# Patient Record
Sex: Female | Born: 1995 | Race: Black or African American | Hispanic: No | Marital: Single | Smoking: Never smoker
Health system: Southern US, Community
[De-identification: ages and names within clinical notes are randomized; demographics above are authoritative.]

## PROBLEM LIST (undated history)

## (undated) ENCOUNTER — Inpatient Hospital Stay (HOSPITAL_COMMUNITY): Payer: Self-pay

## (undated) DIAGNOSIS — R87619 Unspecified abnormal cytological findings in specimens from cervix uteri: Secondary | ICD-10-CM

## (undated) DIAGNOSIS — J45909 Unspecified asthma, uncomplicated: Secondary | ICD-10-CM

## (undated) DIAGNOSIS — J40 Bronchitis, not specified as acute or chronic: Secondary | ICD-10-CM

## (undated) HISTORY — DX: Unspecified abnormal cytological findings in specimens from cervix uteri: R87.619

## (undated) HISTORY — PX: WISDOM TOOTH EXTRACTION: SHX21

---
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
## 2013-03-20 NOTE — L&D Delivery Note (Signed)
I have seen and examined this patient and I agree with the above. Cam HaiSHAW, KIMBERLY CNM 2:16 AM 12/27/2013

## 2013-03-20 NOTE — L&D Delivery Note (Addendum)
Delivery Note At 11:21 PM a viable female was delivered via Vaginal, Spontaneous Delivery (Presentation: ; Occiput Anterior).  Loose nuchal cord x 1. APGAR: 9, 9; weight skin to skin, pending.   Placenta status: Intact, Spontaneous.  Cord: 3 vessels with the following complications: None.    Anesthesia: Epidural  Episiotomy: None Lacerations: 1st degree;Vaginal, perineal Suture Repair: 3.0 vicryl Est. Blood Loss (mL): 200  Meconium fluid noted.  Mom to postpartum.  Baby to Couplet care / Skin to Skin.  Tawni CarnesWight, Bev Drennen 12/26/2013, 11:55 PM

## 2013-04-23 ENCOUNTER — Emergency Department (HOSPITAL_COMMUNITY)
Admission: EM | Admit: 2013-04-23 | Discharge: 2013-04-23 | Disposition: A | Discharge status: Home / Self Care | Payer: No Typology Code available for payment source | Attending: Emergency Medicine | Admitting: Emergency Medicine

## 2013-04-23 ENCOUNTER — Encounter (HOSPITAL_COMMUNITY): Payer: Self-pay | Admitting: Emergency Medicine

## 2013-04-23 DIAGNOSIS — M259 Joint disorder, unspecified: Secondary | ICD-10-CM | POA: Insufficient documentation

## 2013-04-23 DIAGNOSIS — O9989 Other specified diseases and conditions complicating pregnancy, childbirth and the puerperium: Secondary | ICD-10-CM | POA: Insufficient documentation

## 2013-04-23 DIAGNOSIS — Z3490 Encounter for supervision of normal pregnancy, unspecified, unspecified trimester: Secondary | ICD-10-CM

## 2013-04-23 DIAGNOSIS — J111 Influenza due to unidentified influenza virus with other respiratory manifestations: Secondary | ICD-10-CM | POA: Insufficient documentation

## 2013-04-23 DIAGNOSIS — Z79899 Other long term (current) drug therapy: Secondary | ICD-10-CM | POA: Insufficient documentation

## 2013-04-23 DIAGNOSIS — M545 Low back pain, unspecified: Secondary | ICD-10-CM | POA: Insufficient documentation

## 2013-04-23 DIAGNOSIS — J45909 Unspecified asthma, uncomplicated: Secondary | ICD-10-CM | POA: Insufficient documentation

## 2013-04-23 DIAGNOSIS — M899 Disorder of bone, unspecified: Secondary | ICD-10-CM | POA: Insufficient documentation

## 2013-04-23 LAB — URINALYSIS, ROUTINE W REFLEX MICROSCOPIC
BILIRUBIN URINE: NEGATIVE
Glucose, UA: NEGATIVE mg/dL
HGB URINE DIPSTICK: NEGATIVE
KETONES UR: 15 mg/dL — AB
Leukocytes, UA: NEGATIVE
NITRITE: NEGATIVE
PROTEIN: NEGATIVE mg/dL
SPECIFIC GRAVITY, URINE: 1.029 (ref 1.005–1.030)
UROBILINOGEN UA: 1 mg/dL (ref 0.0–1.0)
pH: 6 (ref 5.0–8.0)

## 2013-04-23 LAB — POCT PREGNANCY, URINE: Preg Test, Ur: POSITIVE — AB

## 2013-04-23 LAB — RAPID STREP SCREEN (MED CTR MEBANE ONLY): STREPTOCOCCUS, GROUP A SCREEN (DIRECT): NEGATIVE

## 2013-04-23 MED ORDER — OSELTAMIVIR PHOSPHATE 75 MG PO CAPS: 75.0000 mg | ORAL_CAPSULE | Freq: Two times a day (BID) | ORAL | Status: DC
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
## 2013-04-23 NOTE — ED Notes (Signed)
Pt states had a sore throat yesterday, today no sore throat but has nasal congestion. Pt also reports onset sharp lower back pain today when standing up.

## 2013-04-23 NOTE — Discharge Instructions (Signed)
Influenza, Adult Influenza ("the flu") is a viral infection of the respiratory tract. It occurs more often in winter months because people spend more time in close contact with one another. Influenza can make you feel very sick. Influenza easily spreads from person to person (contagious). CAUSES  Influenza is caused by a virus that infects the respiratory tract. You can catch the virus by breathing in droplets from an infected person's cough or sneeze. You can also catch the virus by touching something that was recently contaminated with the virus and then touching your mouth, nose, or eyes. SYMPTOMS  Symptoms typically last 4 to 10 days and may include:  Fever.  Chills.  Headache, body aches, and muscle aches.  Sore throat.  Chest discomfort and cough.  Poor appetite.  Weakness or feeling tired.  Dizziness.  Nausea or vomiting. DIAGNOSIS  Diagnosis of influenza is often made based on your history and a physical exam. A nose or throat swab test can be done to confirm the diagnosis. RISKS AND COMPLICATIONS You may be at risk for a more severe case of influenza if you smoke cigarettes, have diabetes, have chronic heart disease (such as heart failure) or lung disease (such as asthma), or if you have a weakened immune system. Elderly people and pregnant women are also at risk for more serious infections. The most common complication of influenza is a lung infection (pneumonia). Sometimes, this complication can require emergency medical care and may be life-threatening. PREVENTION  An annual influenza vaccination (flu shot) is the best way to avoid getting influenza. An annual flu shot is now routinely recommended for all adults in the [place] TREATMENT  In mild cases, influenza goes away on its own. Treatment is directed at relieving symptoms. For more severe cases, your caregiver may prescribe antiviral medicines to shorten the sickness. Antibiotic medicines are not effective, because the  infection is caused by a virus, not by bacteria. HOME CARE INSTRUCTIONS  Only take over-the-counter or prescription medicines for pain, discomfort, or fever as directed by your caregiver.  Use a cool mist humidifier to make breathing easier.  Get plenty of rest until your temperature returns to normal. This usually takes 3 to 4 days.  Drink enough fluids to keep your urine clear or pale yellow.  Cover your mouth and nose when coughing or sneezing, and wash your hands well to avoid spreading the virus.  Stay home from work or school until your fever has been gone for at least 1 full day. SEEK MEDICAL CARE IF:   You have chest pain or a deep cough that worsens or produces more mucus.  You have nausea, vomiting, or diarrhea. SEEK IMMEDIATE MEDICAL CARE IF:   You have difficulty breathing, shortness of breath, or your skin or nails turn bluish.  You have severe neck pain or stiffness.  You have a severe headache, facial pain, or earache.  You have a worsening or recurring fever.  You have nausea or vomiting that cannot be controlled. MAKE SURE YOU:  Understand these instructions.  Will watch your condition.  Will get help right away if you are not doing well or get worse. Document Released: 03/03/2000 Document Revised: 09/05/2011 Document Reviewed: 06/05/2011 [facility] Patient Information 2014 [place], [place].  Medicines During Pregnancy During pregnancy, there are medicines that are either safe or unsafe to take. Medicines include prescriptions from your caregiver, over-the-counter medicines, topical creams applied to the skin, and all herbal substances. Medicines are put into either Class A, B, C, or  D. Class A and B medicines have been shown to be safe in pregnancy. Class C medicines are also considered to be safe in pregnancy, but these medicines should only be used when necessary. Class D medicines should not be used at all in pregnancy. They can be harmful to a baby.  It is  best to take as little medicine as possible while pregnant. However, some medicines are necessary to take for the mother and baby's health. Sometimes, it is more dangerous to stop taking certain medicines than to stay on them. This is often the case for people with long-term (chronic) conditions such as asthma, diabetes, or high blood pressure (hypertension). If you are pregnant and have a chronic illness, call your caregiver right away. Bring a list of your medicines and their doses to your appointments. If you are planning to become pregnant, schedule a doctor's appointment and discuss your medicines with your caregiver. Lastly, write down the phone number to your pharmacist. They can answer questions regarding a medicine's class and safety. They cannot give advice as to whether you should or should not be on a medicine.  SAFE AND UNSAFE MEDICINES There is a long list of medicines that are considered safe in pregnancy. Below is a shorter list. For specific medicines, ask your caregiver.  AllergyMedicines Loratadine, cetirizine, and chlorpheniramine are safe to take. Certain nasal steroid sprays are safe. Talk to your caregiver about specific brands that are safe. Analgesics Acetaminophen and acetaminophen with codeine are safe to take. All other nonsteroidal anti-inflammatory drugs (NSAIDS) are not safe. This includes ibuprofen.  Antacids Many over-the-counter antacids are safe to take. Talk to your caregiver about specific brands that are safe. Famotidine, ranitidine, and lansoprazole are safe. Omepresole is considered safe to take in the second trimester. Antibiotic Medicines There are several antibiotics to avoid. These include, but are not limited to, tetracyline, quinolones, and sulfa medications. Talk to your caregiver before taking any antibiotic.  Antihistamines Talk to your caregiver about specific brands that are safe.  Asthma Medicines Most asthma steroid inhalers are safe to take.  Talk to your caregiver for specific details. Calcium Calcium supplements are safe to take. Do not take oyster shell calcium.  Cough and Cold Medicines It is safe to take products with guaifenesin or dextromethorphan. Talk to your caregiver about specific brands that are safe. It is not safe to take products that contain aspirin or ibuprofen. Decongestant Medicines Pseudoephedrine-containing products are safe to take in the second and third trimester.  Depression Medicines Talk about these medicines with your caregiver.  Antidiarrheal Medicines It is safe to take loperamide. Talk to your caregiver about specific brands that are safe. It is not safe to take any antidiarrheal medicine that contains bismuth. Eyedrops Allergy eyedrops should be limited.  Iron It is safe to use certain iron-containing medicines for anemia in pregnancy. They require a prescription.  Antinausea Medicines It is safe to take doxylamine and vitamin B6 as directed. There are other prescription medicines available, if needed.  Sleep aids It is safe to take diphenhydramine and acetaminophen with diphenhydramine.  Steroids Hydrocortisone creams are safe to use as directed. Oral steroids require a prescription. It is not safe to take any hemorrhoid cream with pramoxine or phenylephrine. Stool softener It is safe to take stool softener medicines. Avoid daily or prolonged use of stool softeners. Thyroid Medicine It is important to stay on this thyroid medicine. It needs to be followed by your caregiver.  Vaginal Medicines Your caregiver will prescribe  a medicine to you if you have a vaginal infection. Certain antifungal medicines are safe to use if you have a sexually transmitted infection (STI). Talk to your caregiver.  Document Released: 03/06/2005 Document Revised: 05/29/2011 Document Reviewed: 03/07/2011 [facility] Patient Information 2014 [place], [place].  Pregnancy If you are planning on getting  pregnant, it is a good idea to make a preconception appointment with your caregiver to discuss having a healthy lifestyle before getting pregnant. This includes diet, weight, exercise, taking prenatal vitamins (especially folic acid, which helps prevent brain and spinal cord defects), avoiding alcohol, smoking and illegal drugs, medical problems (diabetes, convulsions), family history of genetic problems, working conditions, and immunizations. It is better to have knowledge of these things and do something about them before getting pregnant. During your pregnancy, it is important to follow certain guidelines in order to have a healthy baby. It is very important to get good prenatal care and follow your caregiver's instructions. Prenatal care includes all the medical care you receive before your baby's birth. This helps to prevent problems during the pregnancy and childbirth. HOME CARE INSTRUCTIONS   Start your prenatal visits by the 12th week of pregnancy or earlier, if possible. At first, appointments are usually scheduled monthly. They become more frequent in the last 2 months before delivery. It is important that you keep your caregiver's appointments and follow your caregiver's instructions regarding medication use, exercise, and diet.  During pregnancy, you are providing food for you and your baby. Eat a regular, well-balanced diet. Choose foods such as meat, fish, milk and other dairy products, vegetables, fruits, whole-grain breads and cereals. Your caregiver will inform you of the ideal weight gain depending on your current height and weight. Drink lots of liquids. Try to drink 8 glasses of water a day.  Alcohol is associated with a number of birth defects including fetal alcohol syndrome. It is best to avoid alcohol completely. Smoking will cause low birth rate and prematurity. Use of alcohol and nicotine during your pregnancy also increases the chances that your child will be chemically dependent  later in their life and may contribute to SIDS (Sudden Infant Death Syndrome).  Do not use illegal drugs.  Only take prescription or over-the-counter medications that are recommended by your caregiver. Other medications can cause genetic and physical problems in the baby.  Morning sickness can often be helped by keeping soda crackers at the bedside. Eat a few before getting up in the morning.  A sexual relationship may be continued until near the end of pregnancy if there are no other problems such as early (premature) leaking of amniotic fluid from the membranes, vaginal bleeding, painful intercourse or belly (abdominal) pain.  Exercise regularly. Check with your caregiver if you are unsure of the safety of some of your exercises.  Do not use hot tubs, steam rooms or saunas. These increase the risk of fainting and hurting yourself and the baby. Swimming is OK for exercise. Get plenty of rest, including afternoon naps when possible, especially in the third trimester.  Avoid toxic odors and chemicals.  Do not wear high heels. They may cause you to lose your balance and fall.  Do not lift over 5 pounds. If you do lift anything, lift with your legs and thighs, not your back.  Avoid long trips, especially in the third trimester.  If you have to travel out of the city or state, take a copy of your medical records with you. SEEK IMMEDIATE MEDICAL CARE IF:  You develop an unexplained oral temperature above 102 F (38.9 C), or as your caregiver suggests.  You have leaking of fluid from the vagina. If leaking membranes are suspected, take your temperature and inform your caregiver of this when you call.  There is vaginal spotting or bleeding. Notify your caregiver of the amount and how many pads are used.  You continue to feel sick to your stomach (nauseous) and have no relief from remedies suggested, or you throw up (vomit) blood or coffee ground like materials.  You develop upper  abdominal pain.  You have round ligament discomfort in the lower abdominal area. This still must be evaluated by your caregiver.  You feel contractions of the uterus.  You do not feel the baby move, or there is less movement than before.  You have painful urination.  You have abnormal vaginal discharge.  You have persistent diarrhea.  You get a severe headache.  You have problems with your vision.  You develop muscle weakness.  You feel dizzy and faint.  You develop shortness of breath.  You develop chest pain.  You have back pain that travels down to your leg and feet.  You feel irregular or a very fast heartbeat.  You develop excessive weight gain in a short period of time (5 pounds in 3 to 5 days).  You are involved in a domestic violence situation. Document Released: 03/06/2005 Document Revised: 09/05/2011 Document Reviewed: 08/28/2008 [facility] Patient Information 2014 [place].  Pregnancy and Medications Most of the time, medicine a pregnant woman is taking does not enter the fetus, but sometimes it can. This may cause damage or birth defects. The risk of damage being done to a fetus is the greatest in the first few weeks of pregnancy. This is when major organs are developing. If you are taking any prescription, over-the-counter or herbal medicines, it is best to talk to your caregiver about the medications you are taking before getting pregnant. If you become pregnant, stop taking over-the-counter and herbal medications right away. Tell your caregiver what you were taking. Also, tell him/her medications, if any, you took before knowing you were pregnant. Never take any drugs during pregnancy unless your caregiver gives you permission. Other things like caffeine, vitamins, herbal teas and remedies can affect the growing fetus. Talk to your caregiver about cutting down on caffeine. Also, ask what type of vitamins you need to take. Never use any herbal product without  talking to your caregiver first.  MEDICINES THAT ARE NOT SAFE TO TAKE DURING PREGNANCY   Category A - drugs that have been tested for safety during pregnancy and have been found to be safe. This includes drugs such as:  Folic acid.  Vitamin B6.  Thyroid medicine in moderation or in prescribed doses.  Category B - drugs that have been used a lot during pregnancy and do not appear to cause major birth defects or other problems. This includes drugs such as:  Some antibiotics.  Acetaminophen (Tylenol).  Aspartame (artificial sweetener).  Famotidine (Pepcid).  Prednisone (cortisone).  Insulin (for diabetes).  Ibuprofen (Advil, Motrin) before the third trimester. Pregnant women should not take ibuprofen during the last three months of pregnancy.  Category C - drugs that are more likely to cause problems for the mother or fetus. It also includes drugs for which safety studies have not been finished. The majority of these drugs do not have safety studies in progress. These drugs often come with a warning that they should be used  only if the benefits of taking them outweigh the risks. This is something a woman would need to carefully discuss with her caregiver. These drugs include:  Prochlorperzaine (Compazine).  Sudafed.  Fluconazole (Diflucan).  Ciprofloxacin (Cipro).  Some antidepressants are also included in this group.  Category D - drugs that have clear health risks for the fetus. They include:  Alcohol.  Lithium (used to treat manic depression).  Phenytoin (Dilantin).  Most chemotherapy drugs to treat cancer. In some cases, chemotherapy drugs are given during pregnancy.  Category X - drugs that have been shown to cause birth defects. They should never be taken during pregnancy. These include:  Drugs to treat skin conditions like cystic acne (Accutane) and psoriasis (Tegison or Soriatane).  Thalidomide (a sedative).  Diethylstilbestrol or DES. No  medication is considered 100% safe to take when pregnant because everyone reacts to drugs differently. Aspirin and other drugs containing salicylate are not recommended during pregnancy, especially during the last three months because it can cause bleeding. In rare cases, a woman's caregiver may want her to use these types of drugs under close watch. Acetylsalicylate, a common ingredient in many over-the-counter painkillers, may make a pregnancy last longer. It may cause severe bleeding before and after delivery.  SHOULD I AVOID TAKING ANY MEDICINE WHILE I AM PREGNANT?  Whether or not you should continue taking medicine during pregnancy is a serious question. However, if you stop taking medicine that you need, this could harm both you and your baby. Talk to your caregiver about if the benefits outweigh the risk for you and your baby.  Pregnant and nursing women who need medication for psychiatric conditions should consult with their obstetrician, pediatrician and mental health care provider before taking any medication. NATURAL MEDICATIONS OR HERBAL REMEDIES WHEN YOU ARE PREGNANT While some herbal remedies claim they will help with pregnancy, there are no studies to prove these claims are true. Likewise, there are very few studies to look at how safe and effective herbal remedies are during pregnancy. Do not take any herbal products without talking to your caregiver first. These products may contain agents that could harm you and the growing fetus. They could cause problems with your pregnancy.  If you think or know that your mother took diethylstilbesterol (DES), a factory made estrogen, when she was pregnant with you, talk with your caregiver right away. Ask her or him about:  What types of tests you may need.  How often they need to be done.  Anything else you may need to do to make sure you do not develop any problems. A woman whose mother was given DES when pregnant should be followed and screened  for abnormalities of her female organs all through her life. OVER-THE-COUNTER MEDICATIONS THAT ARE SAFE TO TAKE DURING PREGNANCY: Any medication taken during pregnancy should be taken only with the permission of your caregiver.  Allergy (Benadryl).  Cold and Flu, Tylenol (acetaminophen). Tylenol Cold, warm salt water gargles, saline nasal drops.  Constipation (Metamucil, Citrucil, Fiberall/Fobricon, Colace, Milk of Magnesia , Senekot).  Diarrhea (Kaopectate, Immodium, Parepectolin). You should not take these in the first trimester and only take the medication for 24 hours. Call your caregiver if you still have the diarrhea.  Headache (Tylenol).  Ointment for cuts and scrapes (J & J, Bacitracin, Neosporin).  Heartburn (Tums, Riopan, titralac , Gaviscon).  Hemorrhoids (Preparation H, anusol, tucks, Witch hazel).  Nausea and vomiting (Vitamin B6 100mg  tablets, emetrol if you are not diabetic, Emetrex, sea bands).  Rashes (hydrocortisone cream or ointment, caladryl lotion or cream, benadryl cream or capsules, oatmeal bath).  Yeast infection of the vagina (Monistat cream or tablets, Terazol cream). Document Released: 03/06/2005 Document Revised: 11/06/2012 Document Reviewed: 11/18/2008 [facility] Patient Information 2014 [place], [place].

## 2013-04-24 NOTE — ED Provider Notes (Signed)
Medical screening examination/treatment/procedure(s) were performed by non-physician practitioner and as supervising physician I was immediately available for consultation/collaboration.  EKG Interpretation   None         Tatanisha Cuthbert F Amedee Cerrone, MD 04/24/13 0038 

## 2013-04-25 LAB — CULTURE, GROUP A STREP

## 2013-05-08 ENCOUNTER — Ambulatory Visit: Payer: Self-pay

## 2013-06-26 ENCOUNTER — Inpatient Hospital Stay (HOSPITAL_COMMUNITY)
Admission: AD | Admit: 2013-06-26 | Discharge: 2013-06-26 | Disposition: A | Discharge status: Home / Self Care | Payer: No Typology Code available for payment source | Source: Ambulatory Visit | Attending: Family Medicine | Admitting: Family Medicine

## 2013-06-26 ENCOUNTER — Encounter (HOSPITAL_COMMUNITY): Payer: Self-pay | Admitting: *Deleted

## 2013-06-26 ENCOUNTER — Inpatient Hospital Stay (HOSPITAL_COMMUNITY): Payer: No Typology Code available for payment source

## 2013-06-26 DIAGNOSIS — O26899 Other specified pregnancy related conditions, unspecified trimester: Secondary | ICD-10-CM

## 2013-06-26 DIAGNOSIS — R109 Unspecified abdominal pain: Secondary | ICD-10-CM | POA: Insufficient documentation

## 2013-06-26 DIAGNOSIS — O9989 Other specified diseases and conditions complicating pregnancy, childbirth and the puerperium: Principal | ICD-10-CM

## 2013-06-26 DIAGNOSIS — O99891 Other specified diseases and conditions complicating pregnancy: Secondary | ICD-10-CM | POA: Insufficient documentation

## 2013-06-26 DIAGNOSIS — J45909 Unspecified asthma, uncomplicated: Secondary | ICD-10-CM | POA: Insufficient documentation

## 2013-06-26 DIAGNOSIS — A630 Anogenital (venereal) warts: Secondary | ICD-10-CM

## 2013-06-26 LAB — URINE MICROSCOPIC-ADD ON

## 2013-06-26 LAB — ABO/RH: ABO/RH(D): A POS

## 2013-06-26 LAB — WET PREP, GENITAL
Trich, Wet Prep: NONE SEEN
Yeast Wet Prep HPF POC: NONE SEEN

## 2013-06-26 LAB — CBC WITH DIFFERENTIAL/PLATELET
Basophils Absolute: 0 10*3/uL (ref 0.0–0.1)
Basophils Relative: 0 % (ref 0–1)
Eosinophils Absolute: 0.1 10*3/uL (ref 0.0–0.7)
Eosinophils Relative: 1 % (ref 0–5)
HEMATOCRIT: 34.9 % — AB (ref 36.0–46.0)
HEMOGLOBIN: 12.5 g/dL (ref 12.0–15.0)
Lymphocytes Relative: 15 % (ref 12–46)
Lymphs Abs: 2.3 10*3/uL (ref 0.7–4.0)
MCH: 31.5 pg (ref 26.0–34.0)
MCHC: 35.8 g/dL (ref 30.0–36.0)
MCV: 87.9 fL (ref 78.0–100.0)
MONO ABS: 0.7 10*3/uL (ref 0.1–1.0)
MONOS PCT: 5 % (ref 3–12)
NEUTROS ABS: 12.2 10*3/uL — AB (ref 1.7–7.7)
Neutrophils Relative %: 80 % — ABNORMAL HIGH (ref 43–77)
Platelets: 211 10*3/uL (ref 150–400)
RBC: 3.97 MIL/uL (ref 3.87–5.11)
RDW: 12.8 % (ref 11.5–15.5)
WBC: 15.3 10*3/uL — ABNORMAL HIGH (ref 4.0–10.5)

## 2013-06-26 LAB — URINALYSIS, ROUTINE W REFLEX MICROSCOPIC
BILIRUBIN URINE: NEGATIVE
GLUCOSE, UA: NEGATIVE mg/dL
HGB URINE DIPSTICK: NEGATIVE
KETONES UR: NEGATIVE mg/dL
Nitrite: NEGATIVE
Protein, ur: NEGATIVE mg/dL
Specific Gravity, Urine: 1.02 (ref 1.005–1.030)
Urobilinogen, UA: 1 mg/dL (ref 0.0–1.0)
pH: 7 (ref 5.0–8.0)

## 2013-06-26 LAB — RPR

## 2013-06-26 LAB — OB RESULTS CONSOLE RUBELLA ANTIBODY, IGM: Rubella: NON-IMMUNE/NOT IMMUNE

## 2013-06-26 LAB — SICKLE CELL SCREEN: SICKLE CELL SCREEN: NEGATIVE

## 2013-06-26 LAB — HIV ANTIBODY (ROUTINE TESTING W REFLEX): HIV 1&2 Ab, 4th Generation: NONREACTIVE

## 2013-06-26 NOTE — MAU Provider Note (Signed)
CSN: [id number]     Arrival date & time 06/26/13  7829 History   None    Chief Complaint  Patient presents with  . Abdominal Pain     (Consider location/radiation/quality/duration/timing/severity/associated sxs/prior Treatment) Patient is a 18 y.o. female presenting with abdominal pain. The history is provided by the patient.  Abdominal Pain The primary symptoms of the illness include abdominal pain and vaginal discharge. The primary symptoms of the illness do not include fever, shortness of breath, nausea, vomiting, diarrhea, dysuria or vaginal bleeding. The current episode started 2 days ago. The onset of the illness was gradual.  The vaginal discharge is not associated with dysuria.  Associated with: pregnancy. The patient states that she believes she is currently pregnant. The patient has not had a change in bowel habit. Additional symptoms associated with the illness include frequency. Symptoms associated with the illness do not include chills, anorexia, diaphoresis, constipation, urgency, hematuria or back pain.   TAIANA TEMKIN is a 18 y.o. G1P0 @ [redacted]w[redacted]d gestation. Current sex partner x 1 year, no birth control, unprotected sex. She presents to the MAU with abdominal pain. She describes the pain as a pressure feeling in the lower abdomen. The pain increases with movement and standing. She denies vaginal bleeding. There is some vaginal discharge. She has never had a pelvic exam or pap smear. She has not started prenatal care. PMH significant for asthma.   Past Medical History  Diagnosis Date  . Asthma   . Bronchitis    Past Surgical History  Procedure Laterality Date  . Wisdom tooth extraction     No family history on file. History  Substance Use Topics  . Smoking status: Never Smoker   . Smokeless tobacco: Not on file  . Alcohol Use: No   OB History   Grav Para Term Preterm Abortions TAB SAB Ect Mult Living   1              Review of Systems  Constitutional: Negative  for fever, chills and diaphoresis.  HENT: Negative.   Eyes: Negative for visual disturbance.  Respiratory: Negative for cough and shortness of breath.   Cardiovascular: Negative for chest pain.  Gastrointestinal: Positive for abdominal pain. Negative for nausea, vomiting, diarrhea, constipation and anorexia.  Genitourinary: Positive for frequency and vaginal discharge. Negative for dysuria, urgency, hematuria and vaginal bleeding.  Musculoskeletal: Negative for back pain.  Skin: Negative for rash.  Neurological: Negative for dizziness and headaches.  Psychiatric/Behavioral: Negative for confusion. The patient is not nervous/anxious.       Allergies  Review of patient's allergies indicates no known allergies.  Home Medications  No current outpatient prescriptions on file. Ht 5\' 9"  (1.753 m)  Wt 155 lb 12.8 oz (70.67 kg)  BMI 23.00 kg/m2  LMP 03/17/2013 Physical Exam  Nursing note and vitals reviewed. Constitutional: She is oriented to person, place, and time. She appears well-developed and well-nourished. No distress.  HENT:  Head: Normocephalic.  Eyes: EOM are normal.  Neck: Neck supple.  Cardiovascular: Normal rate.   Pulmonary/Chest: Effort normal.  Abdominal: Soft. There is no tenderness.  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 Frothy discharge vaginal vault. Cervix closed, long, no CMT, no adnexal tenderness, uterus approximately 14 week size.   Musculoskeletal: Normal range of motion.  Neurological: She is alert and oriented to person, place, and time. No cranial nerve deficit.  Skin: Skin is warm and dry.  Psychiatric: She has a normal  mood and affect. Her behavior is normal.   Results for orders placed during the hospital encounter of 06/26/13 (from the past 24 hour(s))  URINALYSIS, ROUTINE W REFLEX MICROSCOPIC     Status: Abnormal   Collection Time    06/26/13  9:30 AM      Result Value Ref Range   Color,  Urine YELLOW  YELLOW   APPearance CLOUDY (*) CLEAR   Specific Gravity, Urine 1.020  1.005 - 1.030   pH 7.0  5.0 - 8.0   Glucose, UA NEGATIVE  NEGATIVE mg/dL   Hgb urine dipstick NEGATIVE  NEGATIVE   Bilirubin Urine NEGATIVE  NEGATIVE   Ketones, ur NEGATIVE  NEGATIVE mg/dL   Protein, ur NEGATIVE  NEGATIVE mg/dL   Urobilinogen, UA 1.0  0.0 - 1.0 mg/dL   Nitrite NEGATIVE  NEGATIVE   Leukocytes, UA TRACE (*) NEGATIVE  URINE MICROSCOPIC-ADD ON     Status: Abnormal   Collection Time    06/26/13  9:30 AM      Result Value Ref Range   Squamous Epithelial / LPF FEW (*) RARE   WBC, UA 0-2  <3 WBC/hpf   RBC / HPF 0-2  <3 RBC/hpf   Bacteria, UA FEW (*) RARE   Urine-Other MUCOUS PRESENT    WET PREP, GENITAL     Status: Abnormal   Collection Time    06/26/13 10:10 AM      Result Value Ref Range   Yeast Wet Prep HPF POC NONE SEEN  NONE SEEN   Trich, Wet Prep NONE SEEN  NONE SEEN   Clue Cells Wet Prep HPF POC FEW (*) NONE SEEN   WBC, Wet Prep HPF POC FEW (*) NONE SEEN  CBC WITH DIFFERENTIAL     Status: Abnormal   Collection Time    06/26/13 10:20 AM      Result Value Ref Range   WBC 15.3 (*) 4.0 - 10.5 K/uL   RBC 3.97  3.87 - 5.11 MIL/uL   Hemoglobin 12.5  12.0 - 15.0 g/dL   HCT 46.9 (*) 62.9 - 52.8 %   MCV 87.9  78.0 - 100.0 fL   MCH 31.5  26.0 - 34.0 pg   MCHC 35.8  30.0 - 36.0 g/dL   RDW 41.3  24.4 - 01.0 %   Platelets 211  150 - 400 K/uL   Neutrophils Relative % 80 (*) 43 - 77 %   Neutro Abs 12.2 (*) 1.7 - 7.7 K/uL   Lymphocytes Relative 15  12 - 46 %   Lymphs Abs 2.3  0.7 - 4.0 K/uL   Monocytes Relative 5  3 - 12 %   Monocytes Absolute 0.7  0.1 - 1.0 K/uL   Eosinophils Relative 1  0 - 5 %   Eosinophils Absolute 0.1  0.0 - 0.7 K/uL   Basophils Relative 0  0 - 1 %   Basophils Absolute 0.0  0.0 - 0.1 K/uL  ABO/RH     Status: None   Collection Time    06/26/13 10:20 AM      Result Value Ref Range   ABO/RH(D) A POS     Ultrasound + 14 week 3 day IUP with normal fluid  ED  Course  Procedures ( Labs, ultrasound, Pelvic exam, cultures sent for GC and Chlamydia. MDM  18 y.o. female @ [redacted]w[redacted]d gestation with abdominal pain. Will discuss round ligament pain and tylenol. Stable for discharge. I have reviewed this patient's vital signs, nurses notes, appropriate labs  and imaging.  I have discussed findings and plan of care. Patient voices understanding.

## 2013-06-26 NOTE — Discharge Instructions (Signed)
Someone from the GYN office will call you in the next few days to schedule an appointment. If they do not call you can call them to schedule.

## 2013-06-26 NOTE — MAU Note (Signed)
Pt C/O lower abd pain x 2 weeks, only feels it when standing, no other time.  Also N&V.  Denies bleeding.

## 2013-06-27 LAB — GC/CHLAMYDIA PROBE AMP
CT Probe RNA: NEGATIVE
GC PROBE AMP APTIMA: NEGATIVE

## 2013-07-01 LAB — RUBELLA ANTIBODY, IGM: RUBELLA: 0.35 (ref <0.90)

## 2013-07-29 ENCOUNTER — Ambulatory Visit (INDEPENDENT_AMBULATORY_CARE_PROVIDER_SITE_OTHER): Payer: No Typology Code available for payment source | Admitting: Obstetrics and Gynecology

## 2013-07-29 ENCOUNTER — Encounter: Payer: Self-pay | Admitting: Obstetrics and Gynecology

## 2013-07-29 VITALS — BP 124/72 | HR 97 | Temp 98.7°F | Wt 164.4 lb

## 2013-07-29 DIAGNOSIS — O0932 Supervision of pregnancy with insufficient antenatal care, second trimester: Secondary | ICD-10-CM | POA: Insufficient documentation

## 2013-07-29 DIAGNOSIS — A5139 Other secondary syphilis of skin: Secondary | ICD-10-CM

## 2013-07-29 DIAGNOSIS — Z3402 Encounter for supervision of normal first pregnancy, second trimester: Secondary | ICD-10-CM

## 2013-07-29 DIAGNOSIS — A5131 Condyloma latum: Secondary | ICD-10-CM | POA: Insufficient documentation

## 2013-07-29 DIAGNOSIS — O0930 Supervision of pregnancy with insufficient antenatal care, unspecified trimester: Secondary | ICD-10-CM

## 2013-07-29 DIAGNOSIS — Z3480 Encounter for supervision of other normal pregnancy, unspecified trimester: Secondary | ICD-10-CM

## 2013-07-29 DIAGNOSIS — Z3400 Encounter for supervision of normal first pregnancy, unspecified trimester: Secondary | ICD-10-CM

## 2013-07-29 DIAGNOSIS — Z3490 Encounter for supervision of normal pregnancy, unspecified, unspecified trimester: Secondary | ICD-10-CM

## 2013-07-29 LAB — POCT URINALYSIS DIP (DEVICE)
Bilirubin Urine: NEGATIVE
Glucose, UA: NEGATIVE mg/dL
Hgb urine dipstick: NEGATIVE
KETONES UR: NEGATIVE mg/dL
Nitrite: NEGATIVE
PH: 7 (ref 5.0–8.0)
PROTEIN: NEGATIVE mg/dL
Specific Gravity, Urine: 1.02 (ref 1.005–1.030)
UROBILINOGEN UA: 0.2 mg/dL (ref 0.0–1.0)

## 2013-07-29 NOTE — Progress Notes (Signed)
   Subjective:    Judith Little is a G1P0 3855w1d being seen today for her first obstetrical visit.  Her obstetrical history is significant for teen G1. Patient does intend to breast feed. Pregnancy history fully reviewed. 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 GC/CT, WP neg.  Patient reports no complaints.  Filed Vitals:   07/29/13 1340  BP: 124/72  Pulse: 97  Temp: 98.7 F (37.1 C)  Weight: 164 lb 6.4 oz (74.571 kg)    HISTORY: OB History  Gravida Para Term Preterm AB SAB TAB Ectopic Multiple Living  1             # Outcome Date GA Lbr Len/2nd Weight Sex Delivery Anes PTL Lv  1 CUR              Past Medical History  Diagnosis Date  . Asthma   . Bronchitis   No meds for asthma Past Surgical History  Procedure Laterality Date  . Wisdom tooth extraction     Family History  Problem Relation Age of Onset  . Diabetes Maternal Grandmother   . Diabetes Paternal Grandmother      Exam    Uterus:   S=D FHR 140s  Pelvic Exam:    Perineum: No Hemorrhoids No lesions   Vulva: Nl    Vagina:  Minimal possible condlyomatous lesion 1-2 mm at forchette       Cervix: L/C   Adnexa: not evaluated   Bony Pelvis: average  System: Breast:  normal appearance, no masses or tenderness   Skin: normal coloration and turgor, no rashes    Neurologic: normal, grossly non-focal   Extremities: normal strength, tone, and muscle mass   HEENT PERRLA and thyroid without masses   Mouth/Teeth mucous membranes moist, pharynx normal without lesions and dental hygiene good   Neck supple and no masses   Cardiovascular: regular rate and rhythm   Respiratory:  appears well, vitals normal, no respiratory distress, acyanotic, normal RR, ear and throat exam is normal, neck free of mass or lymphadenopathy, chest clear, no wheezing, crepitations, rhonchi, normal symmetric air entry   Abdomen: soft, non-tender; bowel sounds normal; no masses,  no organomegaly S=D   Urinary: urethral  meatus normal      Assessment:    Pregnancy: G1P0 at 4021w2d Supervision of normal first pergnancy Late to care      Plan:     Initial labs drawn. Prenatal vitamins. Problem list reviewed and updated. Genetic Screening discussed Quad Screen: requested.  Ultrasound discussed; fetal survey: ordered.  Follow up in 4 weeks. 50% of 30 min visit spent on counseling and coordination of care.  Visit schedule, pregnnacy precautions and danger signs reviewed   Koichi [place] C Danniel Grenz 07/29/2013

## 2013-07-29 NOTE — Patient Instructions (Signed)
Second Trimester of Pregnancy The second trimester is from week 13 through week 28, months 4 through 6. The second trimester is often a time when you feel your best. Your body has also adjusted to being pregnant, and you begin to feel better physically. Usually, morning sickness has lessened or quit completely, you may have more energy, and you may have an increase in appetite. The second trimester is also a time when the fetus is growing rapidly. At the end of the sixth month, the fetus is about 9 inches long and weighs about 1 pounds. You will likely begin to feel the baby move (quickening) between 18 and 20 weeks of the pregnancy. BODY CHANGES Your body goes through many changes during pregnancy. The changes vary from woman to woman.   Your weight will continue to increase. You will notice your lower abdomen bulging out.  You may begin to get stretch marks on your hips, abdomen, and breasts.  You may develop headaches that can be relieved by medicines approved by your caregiver.  You may urinate more often because the fetus is pressing on your bladder.  You may develop or continue to have heartburn as a result of your pregnancy.  You may develop constipation because certain hormones are causing the muscles that push waste through your intestines to slow down.  You may develop hemorrhoids or swollen, bulging veins (varicose veins).  You may have back pain because of the weight gain and pregnancy hormones relaxing your joints between the bones in your pelvis and as a result of a shift in weight and the muscles that support your balance.  Your breasts will continue to grow and be tender.  Your gums may bleed and may be sensitive to brushing and flossing.  Dark spots or blotches (chloasma, mask of pregnancy) may develop on your face. This will likely fade after the baby is born.  A dark line from your belly button to the pubic area (linea nigra) may appear. This will likely fade after the  baby is born. WHAT TO EXPECT AT YOUR PRENATAL VISITS During a routine prenatal visit:  You will be weighed to make sure you and the fetus are growing normally.  Your blood pressure will be taken.  Your abdomen will be measured to track your baby's growth.  The fetal heartbeat will be listened to.  Any test results from the previous visit will be discussed. Your caregiver may ask you:  How you are feeling.  If you are feeling the baby move.  If you have had any abnormal symptoms, such as leaking fluid, bleeding, severe headaches, or abdominal cramping.  If you have any questions. Other tests that may be performed during your second trimester include:  Blood tests that check for:  Low iron levels (anemia).  Gestational diabetes (between 24 and 28 weeks).  Rh antibodies.  Urine tests to check for infections, diabetes, or protein in the urine.  An ultrasound to confirm the proper growth and development of the baby.  An amniocentesis to check for possible genetic problems.  Fetal screens for spina bifida and Down syndrome. HOME CARE INSTRUCTIONS   Avoid all smoking, herbs, alcohol, and unprescribed drugs. These chemicals affect the formation and growth of the baby.  Follow your caregiver's instructions regarding medicine use. There are medicines that are either safe or unsafe to take during pregnancy.  Exercise only as directed by your caregiver. Experiencing uterine cramps is a good sign to stop exercising.  Continue to eat regular,   healthy meals.  Wear a good support bra for breast tenderness.  Do not use hot tubs, steam rooms, or saunas.  Wear your seat belt at all times when driving.  Avoid raw meat, uncooked cheese, cat litter boxes, and soil used by cats. These carry germs that can cause birth defects in the baby.  Take your prenatal vitamins.  Try taking a stool softener (if your caregiver approves) if you develop constipation. Eat more high-fiber foods,  such as fresh vegetables or fruit and whole grains. Drink plenty of fluids to keep your urine clear or pale yellow.  Take warm sitz baths to soothe any pain or discomfort caused by hemorrhoids. Use hemorrhoid cream if your caregiver approves.  If you develop varicose veins, wear support hose. Elevate your feet for 15 minutes, 3 4 times a day. Limit salt in your diet.  Avoid heavy lifting, wear low heel shoes, and practice good posture.  Rest with your legs elevated if you have leg cramps or low back pain.  Visit your dentist if you have not gone yet during your pregnancy. Use a soft toothbrush to brush your teeth and be gentle when you floss.  A sexual relationship may be continued unless your caregiver directs you otherwise.  Continue to go to all your prenatal visits as directed by your caregiver. SEEK MEDICAL CARE IF:   You have dizziness.  You have mild pelvic cramps, pelvic pressure, or nagging pain in the abdominal area.  You have persistent nausea, vomiting, or diarrhea.  You have a bad smelling vaginal discharge.  You have pain with urination. SEEK IMMEDIATE MEDICAL CARE IF:   You have a fever.  You are leaking fluid from your vagina.  You have spotting or bleeding from your vagina.  You have severe abdominal cramping or pain.  You have rapid weight gain or loss.  You have shortness of breath with chest pain.  You notice sudden or extreme swelling of your face, hands, ankles, feet, or legs.  You have not felt your baby move in over an hour.  You have severe headaches that do not go away with medicine.  You have vision changes. Document Released: 02/28/2001 Document Revised: 11/06/2012 Document Reviewed: 05/07/2012 [employer] Patient Information 2014 [employer].  

## 2013-07-30 LAB — AFP, QUAD SCREEN
AFP: 57.5 IU/mL
Age Alone: 1:1180 {titer}
CURR GEST AGE: 19.1 wks.days
Down Syndrome Scr Risk Est: 1:27500 {titer}
HCG, Total: 17511 m[IU]/mL
INH: 123.6 pg/mL
Interpretation-AFP: NEGATIVE
MoM for AFP: 1.37
MoM for INH: 0.68
MoM for hCG: 1.17
Open Spina bifida: NEGATIVE
Osb Risk: 1:4030 {titer}
TRI 18 SCR RISK EST: NEGATIVE
Trisomy 18 (Edward) Syndrome Interp.: 1:87600 {titer}
UE3 MOM: 0.84
UE3 VALUE: 0.9 ng/mL

## 2013-07-31 LAB — PRESCRIPTION MONITORING PROFILE (19 PANEL)
Amphetamine/Meth: NEGATIVE ng/mL
BUPRENORPHINE, URINE: NEGATIVE ng/mL
Barbiturate Screen, Urine: NEGATIVE ng/mL
Benzodiazepine Screen, Urine: NEGATIVE ng/mL
COCAINE METABOLITES: NEGATIVE ng/mL
Cannabinoid Scrn, Ur: NEGATIVE ng/mL
Carisoprodol, Urine: NEGATIVE ng/mL
Creatinine, Urine: 144.4 mg/dL (ref >20.0)
ECSTASY: NEGATIVE ng/mL
FENTANYL URINE: NEGATIVE ng/mL
METHADONE SCREEN, URINE: NEGATIVE ng/mL
Meperidine, Ur: NEGATIVE ng/mL
Methaqualone: NEGATIVE ng/mL
Nitrites, Initial: NEGATIVE ug/mL
OPIATE SCREEN, URINE: NEGATIVE ng/mL
Oxycodone Screen, Ur: NEGATIVE ng/mL
Phencyclidine, Ur: NEGATIVE ng/mL
Propoxyphene: NEGATIVE ng/mL
Tapentadol, urine: NEGATIVE ng/mL
Tramadol Scrn, Ur: NEGATIVE ng/mL
Zolpidem, Urine: NEGATIVE ng/mL
pH, Initial: 7 pH (ref 4.5–8.9)

## 2013-07-31 LAB — CULTURE, OB URINE
COLONY COUNT: NO GROWTH
Organism ID, Bacteria: NO GROWTH

## 2013-08-04 ENCOUNTER — Ambulatory Visit (HOSPITAL_COMMUNITY)
Admission: RE | Admit: 2013-08-04 | Discharge: 2013-08-04 | Disposition: A | Discharge status: Home / Self Care | Payer: No Typology Code available for payment source | Source: Ambulatory Visit | Attending: Obstetrics and Gynecology | Admitting: Obstetrics and Gynecology

## 2013-08-04 DIAGNOSIS — O98519 Other viral diseases complicating pregnancy, unspecified trimester: Secondary | ICD-10-CM | POA: Insufficient documentation

## 2013-08-04 DIAGNOSIS — O0930 Supervision of pregnancy with insufficient antenatal care, unspecified trimester: Secondary | ICD-10-CM | POA: Insufficient documentation

## 2013-08-04 DIAGNOSIS — A5131 Condyloma latum: Secondary | ICD-10-CM

## 2013-08-04 DIAGNOSIS — Z3490 Encounter for supervision of normal pregnancy, unspecified, unspecified trimester: Secondary | ICD-10-CM

## 2013-08-04 DIAGNOSIS — O0932 Supervision of pregnancy with insufficient antenatal care, second trimester: Secondary | ICD-10-CM

## 2013-08-04 DIAGNOSIS — Z3402 Encounter for supervision of normal first pregnancy, second trimester: Secondary | ICD-10-CM

## 2013-08-04 DIAGNOSIS — A630 Anogenital (venereal) warts: Secondary | ICD-10-CM | POA: Insufficient documentation

## 2013-08-04 DIAGNOSIS — Z3689 Encounter for other specified antenatal screening: Secondary | ICD-10-CM | POA: Insufficient documentation

## 2013-08-26 ENCOUNTER — Encounter: Payer: No Typology Code available for payment source | Admitting: Obstetrics and Gynecology

## 2013-09-17 ENCOUNTER — Ambulatory Visit (INDEPENDENT_AMBULATORY_CARE_PROVIDER_SITE_OTHER): Payer: No Typology Code available for payment source | Admitting: Obstetrics and Gynecology

## 2013-09-17 VITALS — BP 121/81 | HR 100 | Temp 98.2°F | Wt 180.4 lb

## 2013-09-17 DIAGNOSIS — Z3402 Encounter for supervision of normal first pregnancy, second trimester: Secondary | ICD-10-CM

## 2013-09-17 DIAGNOSIS — O0932 Supervision of pregnancy with insufficient antenatal care, second trimester: Secondary | ICD-10-CM

## 2013-09-17 DIAGNOSIS — Z3400 Encounter for supervision of normal first pregnancy, unspecified trimester: Secondary | ICD-10-CM

## 2013-09-17 DIAGNOSIS — O0930 Supervision of pregnancy with insufficient antenatal care, unspecified trimester: Secondary | ICD-10-CM

## 2013-09-17 LAB — POCT URINALYSIS DIP (DEVICE)
Bilirubin Urine: NEGATIVE
Glucose, UA: NEGATIVE mg/dL
Hgb urine dipstick: NEGATIVE
Ketones, ur: NEGATIVE mg/dL
Nitrite: NEGATIVE
PH: 7 (ref 5.0–8.0)
PROTEIN: NEGATIVE mg/dL
SPECIFIC GRAVITY, URINE: 1.02 (ref 1.005–1.030)
Urobilinogen, UA: 0.2 mg/dL (ref 0.0–1.0)

## 2013-09-17 LAB — CBC
HEMATOCRIT: 30.9 % — AB (ref 36.0–46.0)
HEMOGLOBIN: 10.5 g/dL — AB (ref 12.0–15.0)
MCH: 31.3 pg (ref 26.0–34.0)
MCHC: 34 g/dL (ref 30.0–36.0)
MCV: 92 fL (ref 78.0–100.0)
Platelets: 234 10*3/uL (ref 150–400)
RBC: 3.36 MIL/uL — ABNORMAL LOW (ref 3.87–5.11)
RDW: 13.7 % (ref 11.5–15.5)
WBC: 12.5 10*3/uL — ABNORMAL HIGH (ref 4.0–10.5)

## 2013-09-17 NOTE — Patient Instructions (Signed)
Second Trimester of Pregnancy The second trimester is from week 13 through week 28, months 4 through 6. The second trimester is often a time when you feel your best. Your body has also adjusted to being pregnant, and you begin to feel better physically. Usually, morning sickness has lessened or quit completely, you may have more energy, and you may have an increase in appetite. The second trimester is also a time when the fetus is growing rapidly. At the end of the sixth month, the fetus is about 9 inches long and weighs about 1 pounds. You will likely begin to feel the baby move (quickening) between 18 and 20 weeks of the pregnancy. BODY CHANGES Your body goes through many changes during pregnancy. The changes vary from woman to woman.   Your weight will continue to increase. You will notice your lower abdomen bulging out.  You may begin to get stretch marks on your hips, abdomen, and breasts.  You may develop headaches that can be relieved by medicines approved by your health care provider.  You may urinate more often because the fetus is pressing on your bladder.  You may develop or continue to have heartburn as a result of your pregnancy.  You may develop constipation because certain hormones are causing the muscles that push waste through your intestines to slow down.  You may develop hemorrhoids or swollen, bulging veins (varicose veins).  You may have back pain because of the weight gain and pregnancy hormones relaxing your joints between the bones in your pelvis and as a result of a shift in weight and the muscles that support your balance.  Your breasts will continue to grow and be tender.  Your gums may bleed and may be sensitive to brushing and flossing.  Dark spots or blotches (chloasma, mask of pregnancy) may develop on your face. This will likely fade after the baby is born.  A dark line from your belly button to the pubic area (linea nigra) may appear. This will likely fade  after the baby is born.  You may have changes in your hair. These can include thickening of your hair, rapid growth, and changes in texture. Some women also have hair loss during or after pregnancy, or hair that feels dry or thin. Your hair will most likely return to normal after your baby is born. WHAT TO EXPECT AT YOUR PRENATAL VISITS During a routine prenatal visit:  You will be weighed to make sure you and the fetus are growing normally.  Your blood pressure will be taken.  Your abdomen will be measured to track your baby's growth.  The fetal heartbeat will be listened to.  Any test results from the previous visit will be discussed. Your health care provider may ask you:  How you are feeling.  If you are feeling the baby move.  If you have had any abnormal symptoms, such as leaking fluid, bleeding, severe headaches, or abdominal cramping.  If you have any questions. Other tests that may be performed during your second trimester include:  Blood tests that check for:  Low iron levels (anemia).  Gestational diabetes (between 24 and 28 weeks).  Rh antibodies.  Urine tests to check for infections, diabetes, or protein in the urine.  An ultrasound to confirm the proper growth and development of the baby.  An amniocentesis to check for possible genetic problems.  Fetal screens for spina bifida and Down syndrome. HOME CARE INSTRUCTIONS   Avoid all smoking, herbs, alcohol, and unprescribed   drugs. These chemicals affect the formation and growth of the baby.  Follow your health care provider's instructions regarding medicine use. There are medicines that are either safe or unsafe to take during pregnancy.  Exercise only as directed by your health care provider. Experiencing uterine cramps is a good sign to stop exercising.  Continue to eat regular, healthy meals.  Wear a good support bra for breast tenderness.  Do not use hot tubs, steam rooms, or saunas.  Wear your  seat belt at all times when driving.  Avoid raw meat, uncooked cheese, cat litter boxes, and soil used by cats. These carry germs that can cause birth defects in the baby.  Take your prenatal vitamins.  Try taking a stool softener (if your health care provider approves) if you develop constipation. Eat more high-fiber foods, such as fresh vegetables or fruit and whole grains. Drink plenty of fluids to keep your urine clear or pale yellow.  Take warm sitz baths to soothe any pain or discomfort caused by hemorrhoids. Use hemorrhoid cream if your health care provider approves.  If you develop varicose veins, wear support hose. Elevate your feet for 15 minutes, 3-4 times a day. Limit salt in your diet.  Avoid heavy lifting, wear low heel shoes, and practice good posture.  Rest with your legs elevated if you have leg cramps or low back pain.  Visit your dentist if you have not gone yet during your pregnancy. Use a soft toothbrush to brush your teeth and be gentle when you floss.  A sexual relationship may be continued unless your health care provider directs you otherwise.  Continue to go to all your prenatal visits as directed by your health care provider. SEEK MEDICAL CARE IF:   You have dizziness.  You have mild pelvic cramps, pelvic pressure, or nagging pain in the abdominal area.  You have persistent nausea, vomiting, or diarrhea.  You have a bad smelling vaginal discharge.  You have pain with urination. SEEK IMMEDIATE MEDICAL CARE IF:   You have a fever.  You are leaking fluid from your vagina.  You have spotting or bleeding from your vagina.  You have severe abdominal cramping or pain.  You have rapid weight gain or loss.  You have shortness of breath with chest pain.  You notice sudden or extreme swelling of your face, hands, ankles, feet, or legs.  You have not felt your baby move in over an hour.  You have severe headaches that do not go away with  medicine.  You have vision changes. Document Released: 02/28/2001 Document Revised: 03/11/2013 Document Reviewed: 05/07/2012 [employer] Patient Information 2015 [employer]. This information is not intended to replace advice given to you by your health care provider. Make sure you discuss any questions you have with your health care provider.  

## 2013-09-17 NOTE — Progress Notes (Signed)
28 wk labs done. Doing well. Pt 5'9 and FOB 6'2. Discussed diet, recommended light exercise and classes.

## 2013-09-18 LAB — GLUCOSE TOLERANCE, 1 HOUR (50G) W/O FASTING: Glucose, 1 Hour GTT: 101 mg/dL (ref 70–140)

## 2013-09-18 LAB — HIV ANTIBODY (ROUTINE TESTING W REFLEX): HIV 1&2 Ab, 4th Generation: NONREACTIVE

## 2013-09-18 LAB — RPR

## 2013-10-16 ENCOUNTER — Ambulatory Visit (INDEPENDENT_AMBULATORY_CARE_PROVIDER_SITE_OTHER): Payer: No Typology Code available for payment source | Admitting: Obstetrics and Gynecology

## 2013-10-16 ENCOUNTER — Encounter: Payer: Self-pay | Admitting: Obstetrics and Gynecology

## 2013-10-16 VITALS — BP 128/72 | HR 100 | Temp 98.5°F | Wt 188.6 lb

## 2013-10-16 DIAGNOSIS — Z23 Encounter for immunization: Secondary | ICD-10-CM

## 2013-10-16 DIAGNOSIS — O0930 Supervision of pregnancy with insufficient antenatal care, unspecified trimester: Secondary | ICD-10-CM

## 2013-10-16 LAB — POCT URINALYSIS DIP (DEVICE)
Bilirubin Urine: NEGATIVE
Glucose, UA: NEGATIVE mg/dL
HGB URINE DIPSTICK: NEGATIVE
Ketones, ur: NEGATIVE mg/dL
Leukocytes, UA: NEGATIVE
Nitrite: NEGATIVE
PH: 8.5 — AB (ref 5.0–8.0)
PROTEIN: 30 mg/dL — AB
Specific Gravity, Urine: 1.015 (ref 1.005–1.030)
UROBILINOGEN UA: 1 mg/dL (ref 0.0–1.0)

## 2013-10-16 MED ORDER — TETANUS-DIPHTH-ACELL PERTUSSIS 5-2.5-18.5 LF-MCG/0.5 IM SUSP: 0.5000 mL | Freq: Once | INTRAMUSCULAR | Status: DC

## 2013-10-16 NOTE — Progress Notes (Signed)
Doing well. TDAP. Left LBP when bending> avoid precipitating movements.

## 2013-10-16 NOTE — Patient Instructions (Signed)

## 2013-10-16 NOTE — Progress Notes (Signed)
Pain-sharp pain from lower back moves to legs Consented pt for tdap vaccine

## 2013-11-05 ENCOUNTER — Encounter: Payer: Self-pay | Admitting: Physician Assistant

## 2013-11-05 ENCOUNTER — Ambulatory Visit (INDEPENDENT_AMBULATORY_CARE_PROVIDER_SITE_OTHER): Payer: No Typology Code available for payment source | Admitting: Physician Assistant

## 2013-11-05 VITALS — BP 137/75 | HR 92 | Wt 194.3 lb

## 2013-11-05 DIAGNOSIS — Z3402 Encounter for supervision of normal first pregnancy, second trimester: Secondary | ICD-10-CM

## 2013-11-05 DIAGNOSIS — Z3400 Encounter for supervision of normal first pregnancy, unspecified trimester: Secondary | ICD-10-CM

## 2013-11-05 LAB — POCT URINALYSIS DIP (DEVICE)
Bilirubin Urine: NEGATIVE
Glucose, UA: NEGATIVE mg/dL
HGB URINE DIPSTICK: NEGATIVE
Ketones, ur: NEGATIVE mg/dL
NITRITE: NEGATIVE
PH: 7 (ref 5.0–8.0)
PROTEIN: NEGATIVE mg/dL
Specific Gravity, Urine: 1.02 (ref 1.005–1.030)
UROBILINOGEN UA: 1 mg/dL (ref 0.0–1.0)

## 2013-11-05 NOTE — Progress Notes (Signed)
33 week, stable pregnancy Cont PNT Consider pediatrician Trial of Tums - use 2 tabs after dinner/before bedtime.  If no help, will consider other meds.  Dietary changes discussed RTC 2 weeks

## 2013-11-05 NOTE — Progress Notes (Signed)
Pt needs rx for heartburn 

## 2013-11-21 ENCOUNTER — Ambulatory Visit (INDEPENDENT_AMBULATORY_CARE_PROVIDER_SITE_OTHER): Payer: No Typology Code available for payment source | Admitting: Physician Assistant

## 2013-11-21 VITALS — BP 128/61 | HR 99 | Temp 98.3°F | Wt 195.4 lb

## 2013-11-21 DIAGNOSIS — Z3400 Encounter for supervision of normal first pregnancy, unspecified trimester: Secondary | ICD-10-CM

## 2013-11-21 DIAGNOSIS — Z3403 Encounter for supervision of normal first pregnancy, third trimester: Secondary | ICD-10-CM

## 2013-11-21 LAB — POCT URINALYSIS DIP (DEVICE)
Bilirubin Urine: NEGATIVE
Glucose, UA: NEGATIVE mg/dL
Hgb urine dipstick: NEGATIVE
KETONES UR: NEGATIVE mg/dL
NITRITE: NEGATIVE
PH: 7 (ref 5.0–8.0)
PROTEIN: NEGATIVE mg/dL
Specific Gravity, Urine: 1.015 (ref 1.005–1.030)
Urobilinogen, UA: 1 mg/dL (ref 0.0–1.0)

## 2013-11-21 NOTE — Progress Notes (Signed)
35 week IUP, BP stable today No questions or concerns.  Tums were helpful.   Will need GBS/GC/Chlam next visit.   RTC 1 week.

## 2013-11-21 NOTE — Progress Notes (Signed)
C/o of occasional pelvic pressure.

## 2013-11-21 NOTE — Patient Instructions (Signed)
Third Trimester of Pregnancy The third trimester is from week 29 through week 42, months 7 through 9. The third trimester is a time when the fetus is growing rapidly. At the end of the ninth month, the fetus is about 20 inches in length and weighs 6-10 pounds.  BODY CHANGES Your body goes through many changes during pregnancy. The changes vary from woman to woman.   Your weight will continue to increase. You can expect to gain 25-35 pounds (11-16 kg) by the end of the pregnancy.  You may begin to get stretch marks on your hips, abdomen, and breasts.  You may urinate more often because the fetus is moving lower into your pelvis and pressing on your bladder.  You may develop or continue to have heartburn as a result of your pregnancy.  You may develop constipation because certain hormones are causing the muscles that push waste through your intestines to slow down.  You may develop hemorrhoids or swollen, bulging veins (varicose veins).  You may have pelvic pain because of the weight gain and pregnancy hormones relaxing your joints between the bones in your pelvis. Backaches may result from overexertion of the muscles supporting your posture.  You may have changes in your hair. These can include thickening of your hair, rapid growth, and changes in texture. Some women also have hair loss during or after pregnancy, or hair that feels dry or thin. Your hair will most likely return to normal after your baby is born.  Your breasts will continue to grow and be tender. A yellow discharge may leak from your breasts called colostrum.  Your belly button may stick out.  You may feel short of breath because of your expanding uterus.  You may notice the fetus "dropping," or moving lower in your abdomen.  You may have a bloody mucus discharge. This usually occurs a few days to a week before labor begins.  Your cervix becomes thin and soft (effaced) near your due date. WHAT TO EXPECT AT YOUR PRENATAL  EXAMS  You will have prenatal exams every 2 weeks until week 36. Then, you will have weekly prenatal exams. During a routine prenatal visit:  You will be weighed to make sure you and the fetus are growing normally.  Your blood pressure is taken.  Your abdomen will be measured to track your baby's growth.  The fetal heartbeat will be listened to.  Any test results from the previous visit will be discussed.  You may have a cervical check near your due date to see if you have effaced. At around 36 weeks, your caregiver will check your cervix. At the same time, your caregiver will also perform a test on the secretions of the vaginal tissue. This test is to determine if a type of bacteria, Group B streptococcus, is present. Your caregiver will explain this further. Your caregiver may ask you:  What your birth plan is.  How you are feeling.  If you are feeling the baby move.  If you have had any abnormal symptoms, such as leaking fluid, bleeding, severe headaches, or abdominal cramping.  If you have any questions. Other tests or screenings that may be performed during your third trimester include:  Blood tests that check for low iron levels (anemia).  Fetal testing to check the health, activity level, and growth of the fetus. Testing is done if you have certain medical conditions or if there are problems during the pregnancy. FALSE LABOR You may feel small, irregular contractions that   eventually go away. These are called Braxton Hicks contractions, or false labor. Contractions may last for hours, days, or even weeks before true labor sets in. If contractions come at regular intervals, intensify, or become painful, it is best to be seen by your caregiver.  SIGNS OF LABOR   Menstrual-like cramps.  Contractions that are 5 minutes apart or less.  Contractions that start on the top of the uterus and spread down to the lower abdomen and back.  A sense of increased pelvic pressure or back  pain.  A watery or bloody mucus discharge that comes from the vagina. If you have any of these signs before the 37th week of pregnancy, call your caregiver right away. You need to go to the hospital to get checked immediately. HOME CARE INSTRUCTIONS   Avoid all smoking, herbs, alcohol, and unprescribed drugs. These chemicals affect the formation and growth of the baby.  Follow your caregiver's instructions regarding medicine use. There are medicines that are either safe or unsafe to take during pregnancy.  Exercise only as directed by your caregiver. Experiencing uterine cramps is a good sign to stop exercising.  Continue to eat regular, healthy meals.  Wear a good support bra for breast tenderness.  Do not use hot tubs, steam rooms, or saunas.  Wear your seat belt at all times when driving.  Avoid raw meat, uncooked cheese, cat litter boxes, and soil used by cats. These carry germs that can cause birth defects in the baby.  Take your prenatal vitamins.  Try taking a stool softener (if your caregiver approves) if you develop constipation. Eat more high-fiber foods, such as fresh vegetables or fruit and whole grains. Drink plenty of fluids to keep your urine clear or pale yellow.  Take warm sitz baths to soothe any pain or discomfort caused by hemorrhoids. Use hemorrhoid cream if your caregiver approves.  If you develop varicose veins, wear support hose. Elevate your feet for 15 minutes, 3-4 times a day. Limit salt in your diet.  Avoid heavy lifting, wear low heal shoes, and practice good posture.  Rest a lot with your legs elevated if you have leg cramps or low back pain.  Visit your dentist if you have not gone during your pregnancy. Use a soft toothbrush to brush your teeth and be gentle when you floss.  A sexual relationship may be continued unless your caregiver directs you otherwise.  Do not travel far distances unless it is absolutely necessary and only with the approval  of your caregiver.  Take prenatal classes to understand, practice, and ask questions about the labor and delivery.  Make a trial run to the hospital.  Pack your hospital bag.  Prepare the baby's nursery.  Continue to go to all your prenatal visits as directed by your caregiver. SEEK MEDICAL CARE IF:  You are unsure if you are in labor or if your water has broken.  You have dizziness.  You have mild pelvic cramps, pelvic pressure, or nagging pain in your abdominal area.  You have persistent nausea, vomiting, or diarrhea.  You have a bad smelling vaginal discharge.  You have pain with urination. SEEK IMMEDIATE MEDICAL CARE IF:   You have a fever.  You are leaking fluid from your vagina.  You have spotting or bleeding from your vagina.  You have severe abdominal cramping or pain.  You have rapid weight loss or gain.  You have shortness of breath with chest pain.  You notice sudden or extreme swelling   of your face, hands, ankles, feet, or legs.  You have not felt your baby move in over an hour.  You have severe headaches that do not go away with medicine.  You have vision changes. Document Released: 02/28/2001 Document Revised: 03/11/2013 Document Reviewed: 05/07/2012 Exit[employer] Patient Information 2015 [employer]. This information is not intended to replace advice given to you by your health care provider. Make sure you discuss any questions you have with your health care provider.  

## 2013-11-28 ENCOUNTER — Ambulatory Visit (INDEPENDENT_AMBULATORY_CARE_PROVIDER_SITE_OTHER): Payer: No Typology Code available for payment source | Admitting: Obstetrics & Gynecology

## 2013-11-28 VITALS — BP 121/71 | HR 98 | Temp 99.0°F | Wt 198.5 lb

## 2013-11-28 DIAGNOSIS — O0930 Supervision of pregnancy with insufficient antenatal care, unspecified trimester: Secondary | ICD-10-CM

## 2013-11-28 DIAGNOSIS — Z3400 Encounter for supervision of normal first pregnancy, unspecified trimester: Secondary | ICD-10-CM

## 2013-11-28 DIAGNOSIS — Z23 Encounter for immunization: Secondary | ICD-10-CM

## 2013-11-28 DIAGNOSIS — O0932 Supervision of pregnancy with insufficient antenatal care, second trimester: Secondary | ICD-10-CM

## 2013-11-28 DIAGNOSIS — Z3402 Encounter for supervision of normal first pregnancy, second trimester: Secondary | ICD-10-CM

## 2013-11-28 LAB — POCT URINALYSIS DIP (DEVICE)
BILIRUBIN URINE: NEGATIVE
Glucose, UA: NEGATIVE mg/dL
Ketones, ur: NEGATIVE mg/dL
NITRITE: NEGATIVE
Protein, ur: NEGATIVE mg/dL
Specific Gravity, Urine: 1.015 (ref 1.005–1.030)
Urobilinogen, UA: 1 mg/dL (ref 0.0–1.0)
pH: 6.5 (ref 5.0–8.0)

## 2013-11-28 LAB — OB RESULTS CONSOLE GC/CHLAMYDIA
CHLAMYDIA, DNA PROBE: NEGATIVE
GC PROBE AMP, GENITAL: NEGATIVE

## 2013-11-28 LAB — OB RESULTS CONSOLE GBS: STREP GROUP B AG: NEGATIVE

## 2013-11-28 NOTE — Progress Notes (Signed)
Reports occasional pelvic pressure.  GBS and cultures today. Flu vaccine information sheet given-- patient would like to receive today.

## 2013-11-28 NOTE — Progress Notes (Signed)
Pt with no complaints.  NO LOF or ctx or other problems. She is not interested in discussing contraception.  GBS and cx today.  F/u in 1 week Discussed breast feeding

## 2013-11-29 LAB — GC/CHLAMYDIA PROBE AMP
CT Probe RNA: NEGATIVE
GC Probe RNA: NEGATIVE

## 2013-12-01 ENCOUNTER — Encounter: Payer: Self-pay | Admitting: Obstetrics & Gynecology

## 2013-12-01 LAB — CULTURE, BETA STREP (GROUP B ONLY)

## 2013-12-09 ENCOUNTER — Ambulatory Visit (INDEPENDENT_AMBULATORY_CARE_PROVIDER_SITE_OTHER): Payer: No Typology Code available for payment source | Admitting: Obstetrics and Gynecology

## 2013-12-09 VITALS — BP 131/75 | HR 84 | Wt 201.2 lb

## 2013-12-09 DIAGNOSIS — Z3403 Encounter for supervision of normal first pregnancy, third trimester: Secondary | ICD-10-CM

## 2013-12-09 DIAGNOSIS — Z3400 Encounter for supervision of normal first pregnancy, unspecified trimester: Secondary | ICD-10-CM

## 2013-12-09 LAB — POCT URINALYSIS DIP (DEVICE)
BILIRUBIN URINE: NEGATIVE
GLUCOSE, UA: NEGATIVE mg/dL
Hgb urine dipstick: NEGATIVE
Ketones, ur: NEGATIVE mg/dL
NITRITE: NEGATIVE
Protein, ur: NEGATIVE mg/dL
Specific Gravity, Urine: 1.025 (ref 1.005–1.030)
Urobilinogen, UA: 1 mg/dL (ref 0.0–1.0)
pH: 6 (ref 5.0–8.0)

## 2013-12-09 NOTE — Patient Instructions (Signed)

## 2013-12-09 NOTE — Progress Notes (Signed)
Doing well. Cultures neg. Late to care but well-dated. Plans breastfeed, Nexplanon. Signs of labor reviewed.

## 2013-12-16 ENCOUNTER — Ambulatory Visit (INDEPENDENT_AMBULATORY_CARE_PROVIDER_SITE_OTHER): Payer: No Typology Code available for payment source | Admitting: Advanced Practice Midwife

## 2013-12-16 ENCOUNTER — Encounter: Payer: Self-pay | Admitting: Advanced Practice Midwife

## 2013-12-16 VITALS — BP 120/62 | HR 93 | Temp 98.3°F | Wt 203.4 lb

## 2013-12-16 DIAGNOSIS — Z3400 Encounter for supervision of normal first pregnancy, unspecified trimester: Secondary | ICD-10-CM

## 2013-12-16 DIAGNOSIS — Z3403 Encounter for supervision of normal first pregnancy, third trimester: Secondary | ICD-10-CM

## 2013-12-16 LAB — POCT URINALYSIS DIP (DEVICE)
Bilirubin Urine: NEGATIVE
GLUCOSE, UA: NEGATIVE mg/dL
Hgb urine dipstick: NEGATIVE
KETONES UR: NEGATIVE mg/dL
Nitrite: NEGATIVE
Protein, ur: NEGATIVE mg/dL
Specific Gravity, Urine: 1.015 (ref 1.005–1.030)
Urobilinogen, UA: 1 mg/dL (ref 0.0–1.0)
pH: 7 (ref 5.0–8.0)

## 2013-12-16 NOTE — Patient Instructions (Signed)

## 2013-12-16 NOTE — Progress Notes (Signed)
Doing well. Labor precautions reviewed. Cultures negative.

## 2013-12-22 ENCOUNTER — Telehealth: Payer: Self-pay | Admitting: General Practice

## 2013-12-22 NOTE — Telephone Encounter (Signed)
Patient's mother called on her behalf stating she is 9 months pregnant as of Monday and she ate a bacon egg and cheese biscuit this morning and threw it up and there was blood and they would like to know if she needs to go to the ER or not. Called patient and she states she is feeling better since then and has no questions at this time.

## 2013-12-24 ENCOUNTER — Telehealth (HOSPITAL_COMMUNITY): Payer: Self-pay | Admitting: *Deleted

## 2013-12-24 ENCOUNTER — Ambulatory Visit (INDEPENDENT_AMBULATORY_CARE_PROVIDER_SITE_OTHER): Payer: No Typology Code available for payment source | Admitting: Physician Assistant

## 2013-12-24 VITALS — BP 132/71 | HR 96 | Temp 98.6°F | Wt 203.2 lb

## 2013-12-24 DIAGNOSIS — O480 Post-term pregnancy: Secondary | ICD-10-CM

## 2013-12-24 LAB — POCT URINALYSIS DIP (DEVICE)
GLUCOSE, UA: NEGATIVE mg/dL
HGB URINE DIPSTICK: NEGATIVE
Ketones, ur: NEGATIVE mg/dL
NITRITE: NEGATIVE
Protein, ur: 30 mg/dL — AB
Specific Gravity, Urine: 1.025 (ref 1.005–1.030)
Urobilinogen, UA: >=8 mg/dL (ref 0.0–1.0)
pH: 6 (ref 5.0–8.0)

## 2013-12-24 LAB — US OB FOLLOW UP

## 2013-12-24 LAB — HEPATITIS B SURFACE ANTIGEN: Hepatitis B Surface Ag: NEGATIVE

## 2013-12-24 NOTE — Telephone Encounter (Signed)
Preadmission screen  

## 2013-12-24 NOTE — Progress Notes (Signed)
Postdates  

## 2013-12-24 NOTE — Progress Notes (Signed)
40 weeks, no complaints.  Denies vag bleeding, LOF, dysuria.  Reports good fetal movement.  Reactive NST and BPP 8/8 today IOL scheduled as well as NST for bi-weekly testing. Pt did not have Hepatitis testing earlier in pregnancy.   Ordered today. Keep scheduled appts.  Continue PNV.

## 2013-12-24 NOTE — Patient Instructions (Signed)
Third Trimester of Pregnancy The third trimester is from week 29 through week 42, months 7 through 9. The third trimester is a time when the fetus is growing rapidly. At the end of the ninth month, the fetus is about 20 inches in length and weighs 6-10 pounds.  BODY CHANGES Your body goes through many changes during pregnancy. The changes vary from woman to woman.   Your weight will continue to increase. You can expect to gain 25-35 pounds (11-16 kg) by the end of the pregnancy.  You may begin to get stretch marks on your hips, abdomen, and breasts.  You may urinate more often because the fetus is moving lower into your pelvis and pressing on your bladder.  You may develop or continue to have heartburn as a result of your pregnancy.  You may develop constipation because certain hormones are causing the muscles that push waste through your intestines to slow down.  You may develop hemorrhoids or swollen, bulging veins (varicose veins).  You may have pelvic pain because of the weight gain and pregnancy hormones relaxing your joints between the bones in your pelvis. Backaches may result from overexertion of the muscles supporting your posture.  You may have changes in your hair. These can include thickening of your hair, rapid growth, and changes in texture. Some women also have hair loss during or after pregnancy, or hair that feels dry or thin. Your hair will most likely return to normal after your baby is born.  Your breasts will continue to grow and be tender. A yellow discharge may leak from your breasts called colostrum.  Your belly button may stick out.  You may feel short of breath because of your expanding uterus.  You may notice the fetus "dropping," or moving lower in your abdomen.  You may have a bloody mucus discharge. This usually occurs a few days to a week before labor begins.  Your cervix becomes thin and soft (effaced) near your due date. WHAT TO EXPECT AT YOUR PRENATAL  EXAMS  You will have prenatal exams every 2 weeks until week 36. Then, you will have weekly prenatal exams. During a routine prenatal visit:  You will be weighed to make sure you and the fetus are growing normally.  Your blood pressure is taken.  Your abdomen will be measured to track your baby's growth.  The fetal heartbeat will be listened to.  Any test results from the previous visit will be discussed.  You may have a cervical check near your due date to see if you have effaced. At around 36 weeks, your caregiver will check your cervix. At the same time, your caregiver will also perform a test on the secretions of the vaginal tissue. This test is to determine if a type of bacteria, Group B streptococcus, is present. Your caregiver will explain this further. Your caregiver may ask you:  What your birth plan is.  How you are feeling.  If you are feeling the baby move.  If you have had any abnormal symptoms, such as leaking fluid, bleeding, severe headaches, or abdominal cramping.  If you have any questions. Other tests or screenings that may be performed during your third trimester include:  Blood tests that check for low iron levels (anemia).  Fetal testing to check the health, activity level, and growth of the fetus. Testing is done if you have certain medical conditions or if there are problems during the pregnancy. FALSE LABOR You may feel small, irregular contractions that   eventually go away. These are called Braxton Hicks contractions, or false labor. Contractions may last for hours, days, or even weeks before true labor sets in. If contractions come at regular intervals, intensify, or become painful, it is best to be seen by your caregiver.  SIGNS OF LABOR   Menstrual-like cramps.  Contractions that are 5 minutes apart or less.  Contractions that start on the top of the uterus and spread down to the lower abdomen and back.  A sense of increased pelvic pressure or back  pain.  A watery or bloody mucus discharge that comes from the vagina. If you have any of these signs before the 37th week of pregnancy, call your caregiver right away. You need to go to the hospital to get checked immediately. HOME CARE INSTRUCTIONS   Avoid all smoking, herbs, alcohol, and unprescribed drugs. These chemicals affect the formation and growth of the baby.  Follow your caregiver's instructions regarding medicine use. There are medicines that are either safe or unsafe to take during pregnancy.  Exercise only as directed by your caregiver. Experiencing uterine cramps is a good sign to stop exercising.  Continue to eat regular, healthy meals.  Wear a good support bra for breast tenderness.  Do not use hot tubs, steam rooms, or saunas.  Wear your seat belt at all times when driving.  Avoid raw meat, uncooked cheese, cat litter boxes, and soil used by cats. These carry germs that can cause birth defects in the baby.  Take your prenatal vitamins.  Try taking a stool softener (if your caregiver approves) if you develop constipation. Eat more high-fiber foods, such as fresh vegetables or fruit and whole grains. Drink plenty of fluids to keep your urine clear or pale yellow.  Take warm sitz baths to soothe any pain or discomfort caused by hemorrhoids. Use hemorrhoid cream if your caregiver approves.  If you develop varicose veins, wear support hose. Elevate your feet for 15 minutes, 3-4 times a day. Limit salt in your diet.  Avoid heavy lifting, wear low heal shoes, and practice good posture.  Rest a lot with your legs elevated if you have leg cramps or low back pain.  Visit your dentist if you have not gone during your pregnancy. Use a soft toothbrush to brush your teeth and be gentle when you floss.  A sexual relationship may be continued unless your caregiver directs you otherwise.  Do not travel far distances unless it is absolutely necessary and only with the approval  of your caregiver.  Take prenatal classes to understand, practice, and ask questions about the labor and delivery.  Make a trial run to the hospital.  Pack your hospital bag.  Prepare the baby's nursery.  Continue to go to all your prenatal visits as directed by your caregiver. SEEK MEDICAL CARE IF:  You are unsure if you are in labor or if your water has broken.  You have dizziness.  You have mild pelvic cramps, pelvic pressure, or nagging pain in your abdominal area.  You have persistent nausea, vomiting, or diarrhea.  You have a bad smelling vaginal discharge.  You have pain with urination. SEEK IMMEDIATE MEDICAL CARE IF:   You have a fever.  You are leaking fluid from your vagina.  You have spotting or bleeding from your vagina.  You have severe abdominal cramping or pain.  You have rapid weight loss or gain.  You have shortness of breath with chest pain.  You notice sudden or extreme swelling   of your face, hands, ankles, feet, or legs.  You have not felt your baby move in over an hour.  You have severe headaches that do not go away with medicine.  You have vision changes. Document Released: 02/28/2001 Document Revised: 03/11/2013 Document Reviewed: 05/07/2012 Exit[employer] Patient Information 2015 [employer]. This information is not intended to replace advice given to you by your health care provider. Make sure you discuss any questions you have with your health care provider.  

## 2013-12-24 NOTE — Progress Notes (Signed)
Pt desires IOL @ 41 wks -scheduled 10/13 @ 0730

## 2013-12-26 ENCOUNTER — Encounter (HOSPITAL_COMMUNITY): Payer: Self-pay

## 2013-12-26 ENCOUNTER — Inpatient Hospital Stay (HOSPITAL_COMMUNITY)
Admission: AD | Admit: 2013-12-26 | Discharge: 2013-12-28 | DRG: 775 | Disposition: A | Discharge status: Home / Self Care | Payer: No Typology Code available for payment source | Source: Ambulatory Visit | Attending: Obstetrics & Gynecology | Admitting: Obstetrics & Gynecology

## 2013-12-26 ENCOUNTER — Ambulatory Visit (INDEPENDENT_AMBULATORY_CARE_PROVIDER_SITE_OTHER): Payer: No Typology Code available for payment source | Admitting: *Deleted

## 2013-12-26 ENCOUNTER — Encounter (HOSPITAL_COMMUNITY): Payer: No Typology Code available for payment source | Admitting: Anesthesiology

## 2013-12-26 ENCOUNTER — Inpatient Hospital Stay (HOSPITAL_COMMUNITY): Payer: No Typology Code available for payment source | Admitting: Anesthesiology

## 2013-12-26 VITALS — BP 130/73 | HR 96

## 2013-12-26 DIAGNOSIS — Z3A40 40 weeks gestation of pregnancy: Secondary | ICD-10-CM | POA: Diagnosis not present

## 2013-12-26 DIAGNOSIS — Z833 Family history of diabetes mellitus: Secondary | ICD-10-CM

## 2013-12-26 DIAGNOSIS — O480 Post-term pregnancy: Secondary | ICD-10-CM

## 2013-12-26 DIAGNOSIS — O9989 Other specified diseases and conditions complicating pregnancy, childbirth and the puerperium: Secondary | ICD-10-CM | POA: Diagnosis present

## 2013-12-26 DIAGNOSIS — IMO0001 Reserved for inherently not codable concepts without codable children: Secondary | ICD-10-CM

## 2013-12-26 LAB — CBC
HEMATOCRIT: 36 % (ref 36.0–46.0)
Hemoglobin: 12.7 g/dL (ref 12.0–15.0)
MCH: 30.9 pg (ref 26.0–34.0)
MCHC: 35.3 g/dL (ref 30.0–36.0)
MCV: 87.6 fL (ref 78.0–100.0)
PLATELETS: 183 10*3/uL (ref 150–400)
RBC: 4.11 MIL/uL (ref 3.87–5.11)
RDW: 12.6 % (ref 11.5–15.5)
WBC: 22.4 10*3/uL — AB (ref 4.0–10.5)

## 2013-12-26 MED ORDER — ACETAMINOPHEN 325 MG PO TABS: 650.0000 mg | ORAL_TABLET | ORAL | Status: DC | PRN

## 2013-12-26 MED ORDER — LACTATED RINGERS IV SOLN: INTRAVENOUS | Status: DC

## 2013-12-26 MED ORDER — OXYCODONE-ACETAMINOPHEN 5-325 MG PO TABS: 1.0000 | ORAL_TABLET | ORAL | Status: DC | PRN

## 2013-12-26 MED ORDER — LACTATED RINGERS IV SOLN
500.0000 mL | Freq: Once | INTRAVENOUS | Status: AC
  Administered 2013-12-26: 500 mL via INTRAVENOUS

## 2013-12-26 MED ORDER — FENTANYL CITRATE 0.05 MG/ML IJ SOLN
50.0000 ug | INTRAMUSCULAR | Status: DC | PRN
  Administered 2013-12-26: 100 ug via INTRAVENOUS
  Filled 2013-12-26: qty 2

## 2013-12-26 MED ORDER — LIDOCAINE HCL (PF) 1 % IJ SOLN
30.0000 mL | INTRAMUSCULAR | Status: DC | PRN
  Filled 2013-12-26: qty 30

## 2013-12-26 MED ORDER — FENTANYL 2.5 MCG/ML BUPIVACAINE 1/10 % EPIDURAL INFUSION (WH - ANES): 14.0000 mL/h | INTRAMUSCULAR | Status: DC | PRN

## 2013-12-26 MED ORDER — OXYTOCIN BOLUS FROM INFUSION
500.0000 mL | INTRAVENOUS | Status: DC
  Administered 2013-12-26: 500 mL via INTRAVENOUS

## 2013-12-26 MED ORDER — PHENYLEPHRINE 40 MCG/ML (10ML) SYRINGE FOR IV PUSH (FOR BLOOD PRESSURE SUPPORT)
80.0000 ug | PREFILLED_SYRINGE | INTRAVENOUS | Status: DC | PRN
  Filled 2013-12-26: qty 10
  Filled 2013-12-26: qty 2

## 2013-12-26 MED ORDER — FLEET ENEMA 7-19 GM/118ML RE ENEM: 1.0000 | ENEMA | RECTAL | Status: DC | PRN

## 2013-12-26 MED ORDER — ONDANSETRON HCL 4 MG/2ML IJ SOLN: 4.0000 mg | Freq: Four times a day (QID) | INTRAMUSCULAR | Status: DC | PRN

## 2013-12-26 MED ORDER — CITRIC ACID-SODIUM CITRATE 334-500 MG/5ML PO SOLN: 30.0000 mL | ORAL | Status: DC | PRN

## 2013-12-26 MED ORDER — OXYTOCIN 40 UNITS IN LACTATED RINGERS INFUSION - SIMPLE MED
62.5000 mL/h | INTRAVENOUS | Status: DC
  Administered 2013-12-26: 62.5 mL/h via INTRAVENOUS
  Filled 2013-12-26: qty 1000

## 2013-12-26 MED ORDER — FENTANYL 2.5 MCG/ML BUPIVACAINE 1/10 % EPIDURAL INFUSION (WH - ANES)
14.0000 mL/h | INTRAMUSCULAR | Status: DC | PRN
  Administered 2013-12-26: 14 mL/h via EPIDURAL
  Filled 2013-12-26: qty 125

## 2013-12-26 MED ORDER — EPHEDRINE 5 MG/ML INJ
10.0000 mg | INTRAVENOUS | Status: DC | PRN
  Filled 2013-12-26: qty 2

## 2013-12-26 MED ORDER — DIPHENHYDRAMINE HCL 50 MG/ML IJ SOLN: 12.5000 mg | INTRAMUSCULAR | Status: DC | PRN

## 2013-12-26 MED ORDER — LIDOCAINE HCL (PF) 1 % IJ SOLN
INTRAMUSCULAR | Status: DC | PRN
  Administered 2013-12-26: 4 mL
  Administered 2013-12-26: 6 mL

## 2013-12-26 MED ORDER — PHENYLEPHRINE 40 MCG/ML (10ML) SYRINGE FOR IV PUSH (FOR BLOOD PRESSURE SUPPORT)
80.0000 ug | PREFILLED_SYRINGE | INTRAVENOUS | Status: DC | PRN
  Filled 2013-12-26: qty 2

## 2013-12-26 MED ORDER — LACTATED RINGERS IV SOLN: 500.0000 mL | INTRAVENOUS | Status: DC | PRN

## 2013-12-26 MED ORDER — OXYCODONE-ACETAMINOPHEN 5-325 MG PO TABS: 2.0000 | ORAL_TABLET | ORAL | Status: DC | PRN

## 2013-12-26 NOTE — H&P (Signed)
Judith Little is a 18 y.o. female G1P0 at 7145w4d by LMP and 14wk U/S presenting for active labor.  [facility]  Dating LMP, 14wk U/S  Genetic Screen 1 Screen:                 AFP:                    Quad:                  NIPS:  Anatomic US Nl  GTT Early:               Third trimester: 101  TDaP vaccine  10/16/13  Flu vaccine 11/28/2013  GBS 11/28/2013: negative  Contraception undecided  Baby [employer] breast  Circumcision female  Pediatrician   Support Person Mom, boyfriend, sisters   History Contractions started around 10am this morning, have been increasing and more frequent. Denies any LOF, +VB (worse since arriving to the MAU, per MAU nurse consistent with bloody show), +FM. Her pain is worse since arriving to [facility]U.  She denies any problems with prenatal course. Denies HA, changes in vision, RUQ pain.   OB History   Grav Para Term Preterm Abortions TAB SAB Ect Mult Living   1               Past Medical History  Diagnosis Date  . Asthma   . Bronchitis    Past Surgical History  Procedure Laterality Date  . Wisdom tooth extraction     Family History: family history includes Diabetes in her maternal grandmother and paternal grandmother. Social History:  reports that she has never smoked. She does not have any smokeless tobacco history on file. She reports that she does not drink alcohol or use illicit drugs.   Prenatal Transfer Tool  Maternal Diabetes: No Genetic Screening: Declined Maternal Ultrasounds/Referrals: Normal Fetal Ultrasounds or other Referrals:  None Maternal Substance Abuse:  No Significant Maternal Medications:  None Significant Maternal Lab Results:  None Other Comments:  None  Review of Systems  Constitutional: Negative for fever and chills.  Eyes: Negative for double vision.  Respiratory: Negative for shortness of breath.   Cardiovascular: Negative for chest pain.  Gastrointestinal: Negative for abdominal pain.  Neurological: Negative for dizziness  and headaches.  All other systems reviewed and are negative.   Dilation: 9 (BBOW) Effacement (%): 100 Station: -2 Exam by:: D Simpson RN Blood pressure 149/86, pulse 116, resp. rate 18, height 5\' 9"  (1.753 m), weight 204 lb (92.534 kg), last menstrual period 03/17/2013, SpO2 97.00%. Maternal Exam:  Uterine Assessment: Contraction strength is mild.  Contraction duration is 1 minute. Contraction frequency is regular.      Fetal Exam Fetal Monitor Review: Mode: ultrasound.   Baseline rate: 135.  Variability: moderate (6-25 bpm).   Pattern: no accelerations and no decelerations.    Fetal State Assessment: Category I - tracings are normal.     Physical Exam  Constitutional: She is oriented to person, place, and time. She appears well-developed and well-nourished.  HENT:  Head: Normocephalic and atraumatic.  Cardiovascular: Normal rate, regular rhythm, normal heart sounds and intact distal pulses.  Exam reveals no gallop and no friction rub.   No murmur heard. Respiratory: Effort normal and breath sounds normal. No respiratory distress. She has no wheezes.  GI: Soft. There is no tenderness. There is no rebound.  Musculoskeletal: She exhibits no edema.  Neurological: She is alert and oriented to person, place,  and time.  Skin: Skin is warm and dry.  Psychiatric: She has a normal mood and affect. Her behavior is normal.    Prenatal labs: ABO, Rh: --/--/A POS (04/09 1020) Antibody:   Rubella: Nonimmune (04/09 0000) RPR: NON REAC (07/01 1424)  HBsAg: NEGATIVE (10/07 1144)  HIV: NONREACTIVE (07/01 1424)  GBS: Negative (09/11 0000)   Assessment/Plan: Judith NoseJalisha R Riggio is a 18 y.o. G1P0 at 7238w4d by LMP, confirmed 14w U/S here for active labor  #Labor: advanced active labor, membranes intact, expectant management #Pain: Fentanyl #FWB:  cat 1 #ID:  GBS neg #MOF: breast #MOC: nexplanon #Circ:  No, female   Tawni CarnesWight, Andrew 12/26/2013, 8:45 PM  I have seen and examined this  patient and I agree with the above. Cam HaiSHAW, Iria Jamerson CNM 2:18 AM 12/27/2013

## 2013-12-26 NOTE — Progress Notes (Signed)
Category 1 tracing with baseline in 140s.  Moderate variability, multiple accelerations, no decelerations.  

## 2013-12-26 NOTE — Anesthesia Preprocedure Evaluation (Signed)

## 2013-12-26 NOTE — Anesthesia Procedure Notes (Signed)

## 2013-12-26 NOTE — MAU Note (Signed)
Contractions since this am. Closer and stronger. Bloody show.

## 2013-12-27 ENCOUNTER — Encounter (HOSPITAL_COMMUNITY): Payer: Self-pay | Admitting: *Deleted

## 2013-12-27 LAB — CBC
HCT: 30 % — ABNORMAL LOW (ref 36.0–46.0)
HEMOGLOBIN: 10.3 g/dL — AB (ref 12.0–15.0)
MCH: 29.9 pg (ref 26.0–34.0)
MCHC: 34 g/dL (ref 30.0–36.0)
MCV: 88 fL (ref 78.0–100.0)
Platelets: 174 10*3/uL (ref 150–400)
RBC: 3.41 MIL/uL — AB (ref 3.87–5.11)
RDW: 12.7 % (ref 11.5–15.5)
WBC: 25.4 10*3/uL — AB (ref 4.0–10.5)

## 2013-12-27 LAB — RPR

## 2013-12-27 MED ORDER — OXYCODONE-ACETAMINOPHEN 5-325 MG PO TABS: 2.0000 | ORAL_TABLET | ORAL | Status: DC | PRN

## 2013-12-27 MED ORDER — SENNOSIDES-DOCUSATE SODIUM 8.6-50 MG PO TABS
2.0000 | ORAL_TABLET | ORAL | Status: DC
  Administered 2013-12-28: 2 via ORAL
  Filled 2013-12-27: qty 2

## 2013-12-27 MED ORDER — DIPHENHYDRAMINE HCL 25 MG PO CAPS: 25.0000 mg | ORAL_CAPSULE | Freq: Four times a day (QID) | ORAL | Status: DC | PRN

## 2013-12-27 MED ORDER — WITCH HAZEL-GLYCERIN EX PADS: 1.0000 "application " | MEDICATED_PAD | CUTANEOUS | Status: DC | PRN

## 2013-12-27 MED ORDER — BENZOCAINE-MENTHOL 20-0.5 % EX AERO
1.0000 "application " | INHALATION_SPRAY | CUTANEOUS | Status: DC | PRN
  Administered 2013-12-27: 1 via TOPICAL
  Filled 2013-12-27: qty 56

## 2013-12-27 MED ORDER — OXYCODONE-ACETAMINOPHEN 5-325 MG PO TABS: 1.0000 | ORAL_TABLET | ORAL | Status: DC | PRN

## 2013-12-27 MED ORDER — ONDANSETRON HCL 4 MG/2ML IJ SOLN: 4.0000 mg | INTRAMUSCULAR | Status: DC | PRN

## 2013-12-27 MED ORDER — DIBUCAINE 1 % RE OINT: 1.0000 "application " | TOPICAL_OINTMENT | RECTAL | Status: DC | PRN

## 2013-12-27 MED ORDER — IBUPROFEN 600 MG PO TABS
600.0000 mg | ORAL_TABLET | Freq: Four times a day (QID) | ORAL | Status: DC
  Administered 2013-12-27 – 2013-12-28 (×7): 600 mg via ORAL
  Filled 2013-12-27 (×7): qty 1

## 2013-12-27 MED ORDER — SIMETHICONE 80 MG PO CHEW: 80.0000 mg | CHEWABLE_TABLET | ORAL | Status: DC | PRN

## 2013-12-27 MED ORDER — ZOLPIDEM TARTRATE 5 MG PO TABS: 5.0000 mg | ORAL_TABLET | Freq: Every evening | ORAL | Status: DC | PRN

## 2013-12-27 MED ORDER — PRENATAL MULTIVITAMIN CH
1.0000 | ORAL_TABLET | Freq: Every day | ORAL | Status: DC
  Administered 2013-12-27 – 2013-12-28 (×2): 1 via ORAL
  Filled 2013-12-27 (×2): qty 1

## 2013-12-27 MED ORDER — ONDANSETRON HCL 4 MG PO TABS
4.0000 mg | ORAL_TABLET | ORAL | Status: DC | PRN
Start: 2013-12-27 — End: 2013-12-28

## 2013-12-27 MED ORDER — TETANUS-DIPHTH-ACELL PERTUSSIS 5-2.5-18.5 LF-MCG/0.5 IM SUSP: 0.5000 mL | Freq: Once | INTRAMUSCULAR | Status: DC

## 2013-12-27 MED ORDER — LANOLIN HYDROUS EX OINT: TOPICAL_OINTMENT | CUTANEOUS | Status: DC | PRN

## 2013-12-27 NOTE — Progress Notes (Signed)
I have seen and examined this patient and I agree with the above. Cam HaiSHAW, Yanique Mulvihill CNM 8:01 AM 12/27/2013

## 2013-12-27 NOTE — Anesthesia Postprocedure Evaluation (Signed)
Anesthesia Post Note  Patient: Judith Little  Procedure(s) Performed: * No procedures listed *  Anesthesia type: Epidural  Patient location: Mother/Baby  Post pain: Pain level controlled  Post assessment: Post-op Vital signs reviewed  Last Vitals:  Filed Vitals:   12/27/13 0628  BP: 105/45  Pulse: 82  Temp: 37.1 C  Resp: 18    Post vital signs: Reviewed  Level of consciousness: awake  Complications: No apparent anesthesia complications

## 2013-12-27 NOTE — Progress Notes (Signed)
Post Partum Day 1 Subjective: no complaints, up ad lib, voiding, tolerating PO and + flatus. Denies any pain.  Objective: Blood pressure 105/45, pulse 82, temperature 98.8 F (37.1 C), temperature source Oral, resp. rate 18, height 5\' 9"  (1.753 m), weight 92.534 kg (204 lb), last menstrual period 03/17/2013, SpO2 97.00%, unknown if currently breastfeeding.  Physical Exam:  General: alert, cooperative and no distress Lochia: appropriate Uterine Fundus: firm DVT Evaluation: No evidence of DVT seen on physical exam. Negative Homan's sign. No cords or calf tenderness. No significant calf/ankle edema.   Recent Labs  12/26/13 2052 12/27/13 0555  HGB 12.7 10.3*  HCT 36.0 30.0*    Assessment/Plan: Plan for discharge tomorrow, Breastfeeding and Contraception nexplanon   LOS: 1 day   Judith Little, Judith Little 12/27/2013, 7:55 AM

## 2013-12-27 NOTE — Lactation Note (Signed)
This note was copied from the chart of Judith Venetia MaxonJalisha Moraes. Lactation Consultation Note  Patient Name: Judith Little ZOXWR'UToday's Date: 12/27/2013 Reason for consult: Initial assessment Baby 16 hours of life. Mom states that she has been concerned that baby is not getting enough at breast. Reviewed colostrum, swallowing, and supply and demand. Mom states that she still wants to nurse and did nurse again at 1300 for 25 minutes. Mom states that she will call for latch score at next feeding. Mom enc to offer lots of STS, and to nurse with cues. [facility], aware of OP/BFSG and community resources.   Maternal Data Does the patient have breastfeeding experience prior to this delivery?: No  Feeding Feeding Type:  (Baby sleeping, mom states she will call to see a latch. ) Nipple Type: Slow - flow  LATCH Score/Interventions                      Lactation Tools Discussed/Used     Consult Status Consult Status: PRN    Judith Little, Draco Malczewski 12/27/2013, 4:05 PM

## 2013-12-28 MED ORDER — MEASLES, MUMPS & RUBELLA VAC ~~LOC~~ INJ
0.5000 mL | INJECTION | Freq: Once | SUBCUTANEOUS | Status: AC
  Administered 2013-12-28: 0.5 mL via SUBCUTANEOUS
  Filled 2013-12-28 (×2): qty 0.5

## 2013-12-28 MED ORDER — DOCUSATE SODIUM 100 MG PO CAPS: 100.0000 mg | ORAL_CAPSULE | Freq: Two times a day (BID) | ORAL | Status: DC

## 2013-12-28 MED ORDER — IBUPROFEN 600 MG PO TABS: 600.0000 mg | ORAL_TABLET | Freq: Four times a day (QID) | ORAL | Status: DC

## 2013-12-28 NOTE — Plan of Care (Signed)
Problem: Discharge Progression Outcomes Goal: Barriers To Progression Addressed/Resolved Outcome: Not Applicable Date Met:  18/28/83 No barriers to progression

## 2013-12-28 NOTE — Discharge Summary (Cosign Needed)
Obstetric Discharge Summary Reason for Admission: onset of labor Prenatal Procedures: none Intrapartum Procedures: spontaneous vaginal delivery Postpartum Procedures: none Complications-Operative and Postpartum: 1st degree perineal laceration Hemoglobin  Date Value Ref Range Status  12/27/2013 10.3* 12.0 - 15.0 g/dL Final     REPEATED TO VERIFY     DELTA CHECK NOTED     HCT  Date Value Ref Range Status  12/27/2013 30.0* 36.0 - 46.0 % Final    Physical Exam:  General: alert, cooperative and no distress Lochia: appropriate Uterine Fundus: firm DVT Evaluation: No evidence of DVT seen on physical exam. Negative Homan's sign. No cords or calf tenderness. No significant calf/ankle edema.  Discharge Diagnoses: Term Pregnancy-delivered  Discharge Information: Date: 12/28/2013 Activity: pelvic rest Diet: routine Medications: PNV, Ibuprofen and Colace Condition: stable Instructions: refer to practice specific booklet Discharge to: home Follow-up Information   [facility] On [facility]29/2014. (at 1:45pm for postpartum visit)    Specialty:  Obstetrics and Gynecology   Contact information:   [address] [place] [address] [phone]      Newborn Data: Live born female  Birth Weight: 7 lb 5.3 oz (3325 g) APGAR: 9, 9  Home with mother pending pediatrician.  Tawni CarnesWight, Colon Rueth 12/28/2013, 7:44 AM

## 2013-12-28 NOTE — Lactation Note (Signed)
This note was copied from the chart of Judith Venetia MaxonJalisha Valent. Lactation Consultation Note  Reviewed  Engorgement care, supply and demand, monitoring voids/stools. Mom encouraged to feed baby 8-12 times/24 hours and with feeding cues.  Provided mother with a hand pump and reviewed use.   Patient Name: Judith Little Judith Little Reason for consult: Follow-up assessment   Maternal Data    Feeding Feeding Type: Formula Nipple Type: Slow - flow  LATCH Score/Interventions                      Lactation Tools Discussed/Used     Consult Status Consult Status: PRN    Judith Little, Judith Little Little, 12:39 PM

## 2013-12-30 ENCOUNTER — Inpatient Hospital Stay (HOSPITAL_COMMUNITY): Admission: RE | Admit: 2013-12-30 | Payer: No Typology Code available for payment source | Source: Ambulatory Visit

## 2014-01-07 NOTE — Discharge Summary (Signed)
         Discharge Summaries by Nani RavensAndrew M Wight, MD at 12/28/2013 7:44 AM    Author: Nani RavensAndrew M Wight, MD Service: Obstetrics Author Type: Resident   Filed: 12/28/2013 8:01 AM Note Time: 12/28/2013 7:44 AM Status: Cosign Needed   Editor: Nani RavensAndrew M Wight, MD (Resident) Cosign Required: Yes    Obstetric Discharge Summary  Reason for Admission: onset of labor  Prenatal Procedures: none  Intrapartum Procedures: spontaneous vaginal delivery  Postpartum Procedures: none  Complications-Operative and Postpartum: 1st degree perineal laceration     Hemoglobin     Date  Value  Ref Range  Status     12/27/2013  10.3*  12.0 - 15.0 g/dL  Final     REPEATED TO VERIFY     DELTA CHECK NOTED         HCT     Date  Value  Ref Range  Status     12/27/2013  30.0*  36.0 - 46.0 %  Final     Physical Exam:  General: alert, cooperative and no distress  Lochia: appropriate  Uterine Fundus: firm  DVT Evaluation: No evidence of DVT seen on physical exam.  Negative Homan's sign.  No cords or calf tenderness.  No significant calf/ankle edema.  Discharge Diagnoses: Term Pregnancy-delivered  Discharge Information:  Date: 12/28/2013  Activity: pelvic rest  Diet: routine  Medications: PNV, Ibuprofen and Colace  Condition: stable  Instructions: refer to practice specific booklet  Discharge to: home     Follow-up Information      [facility] On 01/29/2014. (at 1:45pm for postpartum visit)      Specialty: Obstetrics and Gynecology      Contact information:      [address]  [place] [address]  [phone]         Newborn Data:  Live born female  Birth Weight: 7 lb 5.3 oz (3325 g)  APGAR: 9, 9  Home with mother pending pediatrician.  Tawni CarnesWight, Andrew  12/28/2013, 7:44 AM

## 2014-01-19 ENCOUNTER — Encounter (HOSPITAL_COMMUNITY): Payer: Self-pay | Admitting: *Deleted

## 2014-01-29 ENCOUNTER — Ambulatory Visit (INDEPENDENT_AMBULATORY_CARE_PROVIDER_SITE_OTHER): Payer: No Typology Code available for payment source | Admitting: Obstetrics and Gynecology

## 2014-01-29 ENCOUNTER — Encounter: Payer: Self-pay | Admitting: Obstetrics and Gynecology

## 2014-01-29 DIAGNOSIS — Z30430 Encounter for insertion of intrauterine contraceptive device: Secondary | ICD-10-CM

## 2014-01-29 DIAGNOSIS — Z30017 Encounter for initial prescription of implantable subdermal contraceptive: Secondary | ICD-10-CM

## 2014-01-29 LAB — POCT PREGNANCY, URINE: Preg Test, Ur: NEGATIVE

## 2014-01-29 MED ORDER — ETONOGESTREL 68 MG ~~LOC~~ IMPL: 1.0000 | DRUG_IMPLANT | Freq: Once | SUBCUTANEOUS | Status: DC

## 2014-01-29 MED ORDER — ETONOGESTREL 68 MG ~~LOC~~ IMPL
68.0000 mg | DRUG_IMPLANT | Freq: Once | SUBCUTANEOUS | Status: AC
  Administered 2014-01-29: 68 mg via SUBCUTANEOUS

## 2014-01-29 NOTE — Progress Notes (Signed)
  Subjective:     Judith Little is a 18 y.o. female who presents for a postpartum visit. She is 4 weeks postpartum following a spontaneous vaginal delivery. I have fully reviewed the prenatal and intrapartum course. The delivery was at 40.4 gestational weeks. Outcome: spontaneous vaginal delivery. Anesthesia: epidural. Postpartum course has been uncomplicated. Baby's course has been uncomplicated. Baby is feeding by breast. Bleeding no bleeding. Bowel function is normal. Bladder function is normal. Patient is not sexually active. Contraception method is none. Postpartum depression screening: negative.     Review of Systems A comprehensive review of systems was negative.   Objective:    There were no vitals taken for this visit.  General:  alert, cooperative and no distress   Breasts:  inspection negative, no nipple discharge or bleeding, no masses or nodularity palpable  Lungs: clear to auscultation bilaterally  Heart:  regular rate and rhythm  Abdomen: soft, non-tender; bowel sounds normal; no masses,  no organomegaly   Vulva:  normal  Vagina: normal vagina, no discharge, exudate, lesion, or erythema  Cervix:  multiparous appearance and no cervical motion tenderness  Corpus: normal size, contour, position, consistency, mobility, non-tender  Adnexa:  normal adnexa and no mass, fullness, tenderness  Rectal Exam: Not performed.        Assessment:     Normal postpartum exam. Pap smear not done at today's visit.   Plan:    1. Contraception: Nexplanon  Patient given informed consent, signed copy in the chart, time out was performed. Pregnancy test was negative. Appropriate time out taken.  Patient's left arm was prepped and draped in the usual sterile fashion.. The ruler used to measure and mark insertion area.  Pt was prepped with alcohol swab and then injected with 1 cc of 1% lidocaine with epinephrine.  Pt was prepped with betadine, Implanon removed form packaging,  Device confirmed  in needle, then inserted full length of needle and withdrawn per handbook instructions.  Pt insertion site covered with a pressure dressing.   Minimal blood loss.  Pt tolerated the procedure well.  2. Patient is medically cleared to resume all activities of daily living 3. Follow up as needed.

## 2014-02-17 ENCOUNTER — Encounter: Payer: Self-pay | Admitting: Obstetrics & Gynecology

## 2014-03-06 ENCOUNTER — Encounter: Payer: Self-pay | Admitting: *Deleted

## 2014-03-27 ENCOUNTER — Emergency Department (INDEPENDENT_AMBULATORY_CARE_PROVIDER_SITE_OTHER)
Admission: EM | Admit: 2014-03-27 | Discharge: 2014-03-27 | Disposition: A | Discharge status: Home / Self Care | Payer: No Typology Code available for payment source | Source: Home / Self Care | Attending: Family Medicine | Admitting: Family Medicine

## 2014-03-27 ENCOUNTER — Encounter (HOSPITAL_COMMUNITY): Payer: Self-pay | Admitting: Emergency Medicine

## 2014-03-27 DIAGNOSIS — J020 Streptococcal pharyngitis: Secondary | ICD-10-CM

## 2014-03-27 DIAGNOSIS — J039 Acute tonsillitis, unspecified: Secondary | ICD-10-CM

## 2014-03-27 LAB — POCT RAPID STREP A: STREPTOCOCCUS, GROUP A SCREEN (DIRECT): NEGATIVE

## 2014-03-27 MED ORDER — DEXAMETHASONE SODIUM PHOSPHATE 10 MG/ML IJ SOLN
INTRAMUSCULAR | Status: AC
  Filled 2014-03-27: qty 1

## 2014-03-27 MED ORDER — DEXAMETHASONE SODIUM PHOSPHATE 10 MG/ML IJ SOLN
10.0000 mg | Freq: Once | INTRAMUSCULAR | Status: AC
  Administered 2014-03-27: 10 mg via INTRAMUSCULAR

## 2014-03-27 MED ORDER — AMOXICILLIN 500 MG PO CAPS: 500.0000 mg | ORAL_CAPSULE | Freq: Two times a day (BID) | ORAL | Status: DC

## 2014-03-27 NOTE — ED Provider Notes (Addendum)
CSN: [id number]     Arrival date & time 03/27/14  1411 History   None    Chief Complaint  Patient presents with  . Sore Throat   (Consider location/radiation/quality/duration/timing/severity/associated sxs/prior Treatment) HPI  Sore throat: started 2 days ago. gtetting worse. L side is worse. Painful to swallow. Achy. Tried cough drops w/ mild benefit. Sick contacts. Denies fevers, facial pain, rash, cough, runny nose or congestion.  Breast feeding.    Past Medical History  Diagnosis Date  . Asthma   . Bronchitis    Past Surgical History  Procedure Laterality Date  . Wisdom tooth extraction     Family History  Problem Relation Age of Onset  . Diabetes Maternal Grandmother   . Diabetes Paternal Grandmother    History  Substance Use Topics  . Smoking status: Never Smoker   . Smokeless tobacco: Never Used  . Alcohol Use: No   OB History    Gravida Para Term Preterm AB TAB SAB Ectopic Multiple Living   Review of Systems Per HPI with all other pertinent systems negative.   Allergies  Review of patient's allergies indicates no known allergies.  Home Medications   Prior to Admission medications   Medication Sig Start Date End Date Taking? Authorizing Provider  amoxicillin (AMOXIL) 500 MG capsule Take 1 capsule (500 mg total) by mouth 2 (two) times daily. 03/27/14   Ozella Rocks, MD  docusate sodium (COLACE) 100 MG capsule Take 1 capsule (100 mg total) by mouth 2 (two) times daily. 12/28/13   Nani Ravens, MD  etonogestrel (NEXPLANON) 68 MG IMPL implant 1 each (68 mg total) by Subdermal route once. 01/29/14   Peggy Constant, MD  ibuprofen (ADVIL,MOTRIN) 600 MG tablet Take 1 tablet (600 mg total) by mouth every 6 (six) hours. 12/28/13   Nani Ravens, MD  Prenatal Vit-Fe Fumarate-FA (PRENATAL MULTIVITAMIN) TABS tablet Take 1 tablet by mouth daily at 12 noon.    Historical Provider, MD  ranitidine (ZANTAC) 150 MG tablet Take 150 mg by mouth daily as  needed for heartburn.    Historical Provider, MD   BP 114/93 mmHg  Pulse 81  Temp(Src) 98.4 F (36.9 C) (Oral)  Resp 16  LMP 03/15/2014  Breastfeeding? Yes Physical Exam  Constitutional: She is oriented to person, place, and time. She appears well-developed and well-nourished. No distress.  HENT:  L tonsil 4+ adn touching uvula R tonsil 2+.  Tonsillar exudate No rinorrhea or sinus congestion on palpation  Eyes: EOM are normal. Pupils are equal, round, and reactive to light.  Neck: Normal range of motion.  Cardiovascular: Normal rate and regular rhythm.   Pulmonary/Chest: Effort normal and breath sounds normal.  Abdominal: Soft. Bowel sounds are normal.  Musculoskeletal: Normal range of motion. She exhibits no edema or tenderness.  Neurological: She is alert and oriented to person, place, and time.  Skin: Skin is warm. She is not diaphoretic.  Psychiatric: She has a normal mood and affect. Her behavior is normal. Judgment and thought content normal.    ED Course  Procedures (including critical care time) Labs Review Labs Reviewed  POCT RAPID STREP A ([facility] CARE ONLY)    Imaging Review No results found.   MDM   1. Tonsillitis   2. Strep throat   Despite Neg rapid strep pt symptoms overwhelmingly concerning for strep throat   Decadron  IM in office Start Amox   BID x 10 days Ibuprofen PRN D[facility] as mother is still breast feeding Precautions given and all questions answered F/u Strep Cx  Shelly Flattenavid Evanna Washinton, MD Family Medicine 03/27/2014, 3:08 PM      Ozella Rocksavid J Mckynleigh Mussell, MD 03/27/14 [id number]  Ozella Rocksavid J Krisna Omar, MD 03/27/14 [phone]

## 2014-03-27 NOTE — Discharge Instructions (Signed)
You likely have strep throat This will be best treated with antibiotics Some of this will go into the breast milk and will change the flavor but will not harm your baby. Please come back if you have additional concerns or take your baby to the pediatrician if needed

## 2014-03-27 NOTE — ED Notes (Signed)
C/o ST onset 2 days; hurts to swallow Sx also include: white pustules and swelling of tonsils Denies fevers, chills, cold sx Alert, no signs of acute distress.

## 2014-04-17 ENCOUNTER — Emergency Department (INDEPENDENT_AMBULATORY_CARE_PROVIDER_SITE_OTHER)
Admission: EM | Admit: 2014-04-17 | Discharge: 2014-04-17 | Disposition: A | Discharge status: Home / Self Care | Payer: No Typology Code available for payment source | Source: Home / Self Care | Attending: Family Medicine | Admitting: Family Medicine

## 2014-04-17 ENCOUNTER — Encounter (HOSPITAL_COMMUNITY): Payer: Self-pay | Admitting: Emergency Medicine

## 2014-04-17 DIAGNOSIS — J039 Acute tonsillitis, unspecified: Secondary | ICD-10-CM | POA: Diagnosis not present

## 2014-04-17 DIAGNOSIS — J020 Streptococcal pharyngitis: Secondary | ICD-10-CM | POA: Diagnosis not present

## 2014-04-17 LAB — POCT RAPID STREP A: STREPTOCOCCUS, GROUP A SCREEN (DIRECT): POSITIVE — AB

## 2014-04-17 MED ORDER — CLINDAMYCIN HCL 300 MG PO CAPS: 300.0000 mg | ORAL_CAPSULE | Freq: Three times a day (TID) | ORAL | Status: DC

## 2014-04-17 NOTE — ED Provider Notes (Signed)
CSN: [id number]     Arrival date & time 04/17/14  1141 History   First MD Initiated Contact with Patient 04/17/14 1208     Chief Complaint  Patient presents with  . Sore Throat   (Consider location/radiation/quality/duration/timing/severity/associated sxs/prior Treatment) HPI Comments: +Recent episode of pharyngitis/tonsillitis. Completed course of amoxicillin beginning 03/27/2014. Symptoms improved while taking amoxicillin, but have since returned.  PCP: none   Patient is a 19 y.o. female presenting with pharyngitis. The history is provided by the patient.  Sore Throat This is a new problem. Episode onset: sx began 5 days ago. The problem occurs constantly. The problem has been gradually worsening.    Past Medical History  Diagnosis Date  . Asthma   . Bronchitis    Past Surgical History  Procedure Laterality Date  . Wisdom tooth extraction     Family History  Problem Relation Age of Onset  . Diabetes Maternal Grandmother   . Diabetes Paternal Grandmother    History  Substance Use Topics  . Smoking status: Never Smoker   . Smokeless tobacco: Never Used  . Alcohol Use: No   OB History    Gravida Para Term Preterm AB TAB SAB Ectopic Multiple Living   1 1 1       1      Review of Systems  All other systems reviewed and are negative.   Allergies  Review of patient's allergies indicates no known allergies.  Home Medications   Prior to Admission medications   Medication Sig Start Date End Date Taking? Authorizing Provider  etonogestrel (NEXPLANON) 68 MG IMPL implant 1 each (68 mg total) by Subdermal route once. 01/29/14  Yes Peggy Constant, MD  amoxicillin (AMOXIL) 500 MG capsule Take 1 capsule (500 mg total) by mouth 2 (two) times daily. 03/27/14   Ozella Rocksavid J Merrell, MD  clindamycin (CLEOCIN) 300 MG capsule Take 1 capsule (300 mg total) by mouth 3 (three) times daily. X 7 days 04/17/14   Mathis FareJennifer Lee H Clayton Bosserman, PA  docusate sodium (COLACE) 100 MG capsule Take 1 capsule (100  mg total) by mouth 2 (two) times daily. 12/28/13   Nani RavensAndrew M Wight, MD  ibuprofen (ADVIL,MOTRIN) 600 MG tablet Take 1 tablet (600 mg total) by mouth every 6 (six) hours. 12/28/13   Nani RavensAndrew M Wight, MD  Prenatal Vit-Fe Fumarate-FA (PRENATAL MULTIVITAMIN) TABS tablet Take 1 tablet by mouth daily at 12 noon.    Historical Provider, MD  ranitidine (ZANTAC) 150 MG tablet Take 150 mg by mouth daily as needed for heartburn.    Historical Provider, MD   BP 106/71 mmHg  Pulse 108  Temp(Src) 97.9 F (36.6 C) (Oral)  Resp 16  SpO2 98%  LMP 04/06/2014  Breastfeeding? Unknown Physical Exam  Constitutional: She is oriented to person, place, and time. She appears well-developed and well-nourished. No distress.  HENT:  Head: Normocephalic and atraumatic.  Right Ear: Hearing, tympanic membrane, external ear and ear canal normal.  Left Ear: Hearing, tympanic membrane, external ear and ear canal normal.  Nose: Nose normal.  Mouth/Throat: Uvula is midline and mucous membranes are normal. No oral lesions. No trismus in the jaw. No uvula swelling. Oropharyngeal exudate, posterior oropharyngeal edema and posterior oropharyngeal erythema present. No tonsillar abscesses.    Eyes: Conjunctivae are normal.  Neck: Normal range of motion. Neck supple.  Cardiovascular: Normal rate, regular rhythm and normal heart sounds.   Pulmonary/Chest: Effort normal and breath sounds normal. No stridor.  Musculoskeletal: Normal range of motion.  Lymphadenopathy:  She has no cervical adenopathy.  Neurological: She is alert and oriented to person, place, and time.  Skin: Skin is warm and dry. No rash noted. No erythema.  Psychiatric: She has a normal mood and affect. Her behavior is normal.  Nursing note and vitals reviewed.   ED Course  Procedures (including critical care time) Labs Review Labs Reviewed  POCT RAPID STREP A ([facility] ONLY) - Abnormal; Notable for the following:    Streptococcus, Group A Screen  (Direct) POSITIVE (*)    All other components within normal limits    Imaging Review No results found.   MDM   1. Acute tonsillitis   2. Strep pharyngitis   Contacted [facility] pharmacist who confirmed clindamycin to be safe in breast feeding. Advised patient to take medication as prescribed and to follow up here or with ENT provider should symptoms not improve or continue to reoccur.   Ria Clock, [place] 04/17/14 [phone]

## 2014-04-17 NOTE — Discharge Instructions (Signed)
Salt Water Gargle °This solution will help make your mouth and throat feel better. °HOME CARE INSTRUCTIONS  °· Mix 1 teaspoon of salt in 8 ounces of warm water. °· Gargle with this solution as much or often as you need or as directed. Swish and gargle gently if you have any sores or wounds in your mouth. °· Do not swallow this mixture. °Document Released: 12/09/2003 Document Revised: 05/29/2011 Document Reviewed: 05/01/2008 °ExitCare® Patient Information ©2015 [employer]. This information is not intended to replace advice given to you by your health care provider. Make sure you discuss any questions you have with your health care provider. °Strep Throat °Strep throat is an infection of the throat caused by a bacteria named Streptococcus pyogenes. Your health care provider may call the infection streptococcal "tonsillitis" or "pharyngitis" depending on whether there are signs of inflammation in the tonsils or back of the throat. Strep throat is most common in children aged 5-15 years during the cold months of the year, but it can occur in people of any age during any season. This infection is spread from person to person (contagious) through coughing, sneezing, or other close contact. °SIGNS AND SYMPTOMS  °· Fever or chills. °· Painful, swollen, red tonsils or throat. °· Pain or difficulty when swallowing. °· White or yellow spots on the tonsils or throat. °· Swollen, tender lymph nodes or "glands" of the neck or under the jaw. °· Red rash all over the body (rare). °DIAGNOSIS  °Many different infections can cause the same symptoms. A test must be done to confirm the diagnosis so the right treatment can be given. A "rapid strep test" can help your health care provider make the diagnosis in a few minutes. If this test is not available, a light swab of the infected area can be used for a throat culture test. If a throat culture test is done, results are usually available in a day or two. °TREATMENT  °Strep throat is  treated with antibiotic medicine. °HOME CARE INSTRUCTIONS  °· Gargle with 1 tsp of salt in 1 cup of warm water, 3-4 times per day or as needed for comfort. °· Family members who also have a sore throat or fever should be tested for strep throat and treated with antibiotics if they have the strep infection. °· Make sure everyone in your household washes their hands well. °· Do not share food, drinking cups, or personal items that could cause the infection to spread to others. °· You may need to eat a soft food diet until your sore throat gets better. °· Drink enough water and fluids to keep your urine clear or pale yellow. This will help prevent dehydration. °· Get plenty of rest. °· Stay home from school, day care, or work until you have been on antibiotics for 24 hours. °· Take medicines only as directed by your health care provider. °· Take your antibiotic medicine as directed by your health care provider. Finish it even if you start to feel better. °SEEK MEDICAL CARE IF:  °· The glands in your neck continue to enlarge. °· You develop a rash, cough, or earache. °· You cough up green, yellow-brown, or bloody sputum. °· You have pain or discomfort not controlled by medicines. °· Your problems seem to be getting worse rather than better. °· You have a fever. °SEEK IMMEDIATE MEDICAL CARE IF:  °· You develop any new symptoms such as vomiting, severe headache, stiff or painful neck, chest pain, shortness of breath, or trouble   trouble swallowing.  You develop severe throat pain, drooling, or changes in your voice.  You develop swelling of the neck, or the skin on the neck becomes red and tender.  You develop signs of dehydration, such as fatigue, dry mouth, and decreased urination.  You become increasingly sleepy, or you cannot wake up completely. MAKE SURE YOU:  Understand these instructions.  Will watch your condition.  Will get help right away if you are not doing well or get worse. Document Released:  03/03/2000 Document Revised: 07/21/2013 Document Reviewed: 05/05/2010 [facility] Patient Information 2015 [place], [place]. This information is not intended to replace advice given to you by your health care provider. Make sure you discuss any questions you have with your health care provider.  Strep Throat Strep throat is an infection of the throat caused by a bacteria named Streptococcus pyogenes. Your health care provider may call the infection streptococcal "tonsillitis" or "pharyngitis" depending on whether there are signs of inflammation in the tonsils or back of the throat. Strep throat is most common in children aged 5-15 years during the cold months of the year, but it can occur in people of any age during any season. This infection is spread from person to person (contagious) through coughing, sneezing, or other close contact. SIGNS AND SYMPTOMS   Fever or chills.  Painful, swollen, red tonsils or throat.  Pain or difficulty when swallowing.  White or yellow spots on the tonsils or throat.  Swollen, tender lymph nodes or "glands" of the neck or under the jaw.  Red rash all over the body (rare). DIAGNOSIS  Many different infections can cause the same symptoms. A test must be done to confirm the diagnosis so the right treatment can be given. A "rapid strep test" can help your health care provider make the diagnosis in a few minutes. If this test is not available, a light swab of the infected area can be used for a throat culture test. If a throat culture test is done, results are usually available in a day or two. TREATMENT  Strep throat is treated with antibiotic medicine. HOME CARE INSTRUCTIONS   Gargle with 1 tsp of salt in 1 cup of warm water, 3-4 times per day or as needed for comfort.  Family members who also have a sore throat or fever should be tested for strep throat and treated with antibiotics if they have the strep infection.  Make sure everyone in your household washes  their hands well.  Do not share food, drinking cups, or personal items that could cause the infection to spread to others.  You may need to eat a soft food diet until your sore throat gets better.  Drink enough water and fluids to keep your urine clear or pale yellow. This will help prevent dehydration.  Get plenty of rest.  Stay home from school, day care, or work until you have been on antibiotics for 24 hours.  Take medicines only as directed by your health care provider.  Take your antibiotic medicine as directed by your health care provider. Finish it even if you start to feel better. SEEK MEDICAL CARE IF:   The glands in your neck continue to enlarge.  You develop a rash, cough, or earache.  You cough up green, yellow-brown, or bloody sputum.  You have pain or discomfort not controlled by medicines.  Your problems seem to be getting worse rather than better.  You have a fever. SEEK IMMEDIATE MEDICAL CARE IF:   You  develop any new symptoms such as vomiting, severe headache, stiff or painful neck, chest pain, shortness of breath, or trouble swallowing.  You develop severe throat pain, drooling, or changes in your voice.  You develop swelling of the neck, or the skin on the neck becomes red and tender.  You develop signs of dehydration, such as fatigue, dry mouth, and decreased urination.  You become increasingly sleepy, or you cannot wake up completely. MAKE SURE YOU:  Understand these instructions.  Will watch your condition.  Will get help right away if you are not doing well or get worse. Document Released: 03/03/2000 Document Revised: 07/21/2013 Document Reviewed: 05/05/2010 [facility] Patient Information 2015 [place]. This information is not intended to replace advice given to you by your health care provider. Make sure you discuss any questions you have with your health care provider.  Tonsillitis Tonsillitis is an infection of the throat that causes  the tonsils to become red, tender, and swollen. Tonsils are collections of lymphoid tissue at the back of the throat. Each tonsil has crevices (crypts). Tonsils help fight nose and throat infections and keep infection from spreading to other parts of the body for the first 18 months of life.  CAUSES Sudden (acute) tonsillitis is usually caused by infection with streptococcal bacteria. Long-lasting (chronic) tonsillitis occurs when the crypts of the tonsils become filled with pieces of food and bacteria, which makes it easy for the tonsils to become repeatedly infected. SYMPTOMS  Symptoms of tonsillitis include:  A sore throat, with possible difficulty swallowing.  White patches on the tonsils.  Fever.  Tiredness.  New episodes of snoring during sleep, when you did not snore before.  Small, foul-smelling, yellowish-white pieces of material (tonsilloliths) that you occasionally cough up or spit out. The tonsilloliths can also cause you to have bad breath. DIAGNOSIS Tonsillitis can be diagnosed through a physical exam. Diagnosis can be confirmed with the results of lab tests, including a throat culture. TREATMENT  The goals of tonsillitis treatment include the reduction of the severity and duration of symptoms and prevention of associated conditions. Symptoms of tonsillitis can be improved with the use of steroids to reduce the swelling. Tonsillitis caused by bacteria can be treated with antibiotic medicines. Usually, treatment with antibiotic medicines is started before the cause of the tonsillitis is known. However, if it is determined that the cause is not bacterial, antibiotic medicines will not treat the tonsillitis. If attacks of tonsillitis are severe and frequent, your health care provider may recommend surgery to remove the tonsils (tonsillectomy). HOME CARE INSTRUCTIONS   Rest as much as possible and get plenty of sleep.  Drink plenty of fluids. While the throat is very sore, eat soft  foods or liquids, such as sherbet, soups, or instant breakfast drinks.  Eat frozen ice pops.  Gargle with a warm or cold liquid to help soothe the throat. Mix 1/4 teaspoon of salt and 1/4 teaspoon of baking soda in 8 oz of water. SEEK MEDICAL CARE IF:   Large, tender lumps develop in your neck.  A rash develops.  A green, yellow-brown, or bloody substance is coughed up.  You are unable to swallow liquids or food for 24 hours.  You notice that only one of the tonsils is swollen. SEEK IMMEDIATE MEDICAL CARE IF:   You develop any new symptoms such as vomiting, severe headache, stiff neck, chest pain, or trouble breathing or swallowing.  You have severe throat pain along with drooling or voice changes.  You  have severe pain, unrelieved with recommended medications.  You are unable to fully open the mouth.  You develop redness, swelling, or severe pain anywhere in the neck.  You have a fever. MAKE SURE YOU:   Understand these instructions.  Will watch your condition.  Will get help right away if you are not doing well or get worse. Document Released: 12/14/2004 Document Revised: 07/21/2013 Document Reviewed: 08/23/2012 [facility] Patient Information 2015 [place], [place]. This information is not intended to replace advice given to you by your health care provider. Make sure you discuss any questions you have with your health care provider.

## 2014-04-17 NOTE — ED Notes (Signed)
Pt states that she has had a sore throat since 04/12/2014 with no symptoms of it feeling better.

## 2014-06-26 ENCOUNTER — Encounter: Payer: Self-pay | Admitting: Medical

## 2014-06-26 ENCOUNTER — Ambulatory Visit (INDEPENDENT_AMBULATORY_CARE_PROVIDER_SITE_OTHER): Payer: Medicaid Other | Admitting: Medical

## 2014-06-26 VITALS — BP 129/75 | HR 91 | Temp 97.8°F | Ht 69.0 in | Wt 167.4 lb

## 2014-06-26 DIAGNOSIS — R87619 Unspecified abnormal cytological findings in specimens from cervix uteri: Secondary | ICD-10-CM

## 2014-06-26 DIAGNOSIS — Z01419 Encounter for gynecological examination (general) (routine) without abnormal findings: Secondary | ICD-10-CM | POA: Diagnosis not present

## 2014-06-26 DIAGNOSIS — Z0000 Encounter for general adult medical examination without abnormal findings: Secondary | ICD-10-CM | POA: Diagnosis not present

## 2014-06-26 HISTORY — DX: Unspecified abnormal cytological findings in specimens from cervix uteri: R87.619

## 2014-06-26 NOTE — Progress Notes (Signed)
Patient ID: Judith Little Rallis, female   DOB: 04/30/1995, 19 y.o.   MRN: [id number] Subjective:    Judith Little Hladik is a 19 y.o. female who presents for an annual exam. The patient has no complaints today. The patient is not currently sexually active. Patient has Nexplanon for birth control since 01/2014. She states irregular periods since insertion. GYN screening history: no prior history of gyn screening tests. The patient wears seatbelts: yes. The patient participates in regular exercise: yes. Has the patient ever been transfused or tattooed?: yes. Tattoo only, no history of blood transfusion.The patient reports that there is not domestic violence in her life.   Menstrual History: OB History    Gravida Para Term Preterm AB TAB SAB Ectopic Multiple Living   1 1 1       1       Menarche age: [age]  No LMP recorded. Patient has had an implant.    The following portions of the patient's history were reviewed and updated as appropriate: allergies, current medications, past family history, past medical history, past social history, past surgical history and problem list.  Review of Systems Constitutional: negative for fevers Gastrointestinal: negative for abdominal pain, change in bowel habits, constipation and diarrhea Genitourinary:negative for vaginal discharge    Objective:     BP 129/75 mmHg  Pulse 91  Temp(Src) 97.8 F (36.6 C) (Oral)  Ht 5\' 9"  (1.753 m)  Wt 167 lb 6.4 oz (75.932 kg)  BMI 24.71 kg/m2  Breastfeeding? Yes GENERAL: Well-developed, well-nourished female in no acute distress.  HEENT: Normocephalic, atraumatic LUNGS: Clear to auscultation bilaterally.  HEART: Regular rate and rhythm. BREASTS: Not performed ABDOMEN: Soft, nontender, nondistended. No organomegaly. Normal bowel sounds in all 4 quadrants.  PELVIC: Normal external female genitalia. Vagina is pink and rugated.  Normal discharge. Normal cervix contour. Pap smear obtained. Uterus is normal in size. No adnexal  mass or tenderness.  EXTREMITIES: No cyanosis, clubbing, or edema   Assessment:    Healthy female exam.   GC/Chlamydia on pap today   Plan:     Patient will be contacted with any abnormal results   [facility]. Next pap smear will be due 06/2017 if results are normal today [facility] PRN  Marny LowensteinJulie N Jeryl Umholtz, PA-C 06/26/2014 11:33 AM

## 2014-06-26 NOTE — Patient Instructions (Signed)
Pap Test A Pap test checks the cells on the surface of your cervix. Your doctor will look for cell changes that are not normal, an infection, or cancer. If the cells no longer look normal, it is called dysplasia. Dysplasia can turn into cancer. Regular Pap tests are important to stop cancer from developing. BEFORE THE PROCEDURE  Ask your doctor when to schedule your Pap test. Timing the test around your period may be important.  Do not douche or have sex (intercourse) for 24 hours before the test.  Do not put creams on your vagina or use tampons for 24 hours before the test.  Go pee (urinate) just before the test. PROCEDURE  You will lie on an exam table with your feet in stirrups.  A warm metal or plastic tool (speculum) will be put in your vagina to open it up.  Your doctor will use a small, plastic brush or wooden spatula to take cells from your cervix.  The cells will be put in a lab container.  The cells will be checked under a microscope to see if they are normal or not. AFTER THE PROCEDURE Get your test results. If they are abnormal, you may need more tests. Document Released: 04/08/2010 Document Revised: 05/29/2011 Document Reviewed: 03/02/2011 [employer] Patient Information 2015 [employer]. This information is not intended to replace advice given to you by your health care provider. Make sure you discuss any questions you have with your health care provider.  

## 2014-06-29 LAB — CYTOLOGY - PAP

## 2014-07-01 ENCOUNTER — Telehealth: Payer: Self-pay

## 2014-07-01 NOTE — Telephone Encounter (Signed)
Called patient and informed her of results, discussed colpo in depth and informed her of appointment date, time and location. Patient verbalized understanding and gratitude, stated she will be there and has no questions or concerns.

## 2014-07-01 NOTE — Telephone Encounter (Signed)
-----   Message from Vivien Rotaheryl A Clinton sent at 07/01/2014  8:46 AM EDT ----- Regarding: FW: schedule colpo Appointment is 05/05 @ 2:10   ----- Message -----    From: Drucilla Schmidtiane L Day, RN    Sent: 07/01/2014   8:03 AM      To: [facility] Admin Pool Subject: schedule colpo                                 Please schedule colpo then return message to clinical pool. We will call the pt.  Thanks  ----- Message -----    From: Marny LowensteinJulie N Wenzel, PA-C    Sent: 06/30/2014   9:28 PM      To: [facility] Clinical Pool  Please call patient and inform her of need for colpo for abnormal pap smear  Thanks,   Raynelle FanningJulie

## 2014-07-23 ENCOUNTER — Encounter: Payer: No Typology Code available for payment source | Admitting: Medical

## 2014-08-13 ENCOUNTER — Other Ambulatory Visit (HOSPITAL_COMMUNITY)
Admission: RE | Admit: 2014-08-13 | Discharge: 2014-08-13 | Disposition: A | Discharge status: Home / Self Care | Payer: Medicaid Other | Source: Ambulatory Visit | Attending: Medical | Admitting: Medical

## 2014-08-13 ENCOUNTER — Encounter: Payer: Medicaid Other | Admitting: Obstetrics and Gynecology

## 2014-08-13 ENCOUNTER — Encounter: Payer: Self-pay | Admitting: Medical

## 2014-08-13 ENCOUNTER — Ambulatory Visit (INDEPENDENT_AMBULATORY_CARE_PROVIDER_SITE_OTHER): Payer: Medicaid Other | Admitting: Medical

## 2014-08-13 VITALS — BP 130/78 | HR 102 | Temp 98.3°F | Ht 69.0 in | Wt 163.7 lb

## 2014-08-13 DIAGNOSIS — R87618 Other abnormal cytological findings on specimens from cervix uteri: Secondary | ICD-10-CM | POA: Insufficient documentation

## 2014-08-13 DIAGNOSIS — R87612 Low grade squamous intraepithelial lesion on cytologic smear of cervix (LGSIL): Secondary | ICD-10-CM

## 2014-08-13 DIAGNOSIS — Z3202 Encounter for pregnancy test, result negative: Secondary | ICD-10-CM

## 2014-08-13 DIAGNOSIS — R87619 Unspecified abnormal cytological findings in specimens from cervix uteri: Secondary | ICD-10-CM | POA: Insufficient documentation

## 2014-08-13 LAB — POCT PREGNANCY, URINE
Preg Test, Ur: NEGATIVE
Preg Test, Ur: NEGATIVE

## 2014-08-13 NOTE — Progress Notes (Signed)
Patient ID: Judith NoseJalisha R Mamone, female   DOB: 1995-09-12, 19 y.o.   MRN: [id number]     GYNECOLOGY CLINIC COLPOSCOPY PROCEDURE NOTE  Ms. Judith Little is a 19 y.o. G1P1001 here for colposcopy for low-grade squamous intraepithelial neoplasia (LGSIL - encompassing HPV,mild dysplasia,CIN I) also mentioned "there are cells present where higher grade lesions cannot be excluded" pap smear on 06/26/14. Discussed role for HPV in cervical dysplasia, need for surveillance.  Patient given informed consent, signed copy in the chart, time out was performed.  Placed in lithotomy position. Cervix viewed with speculum and colposcope after application of acetic acid.   Colposcopy adequate? Yes  acetowhite lesion(s) noted at 12 o'clock, 1 o'clock and 6 o'clock; biopsies obtained.  ECC specimen obtained. All specimens were labelled and sent to pathology.  Patient was given post procedure instructions.  Will follow up pathology and manage accordingly.  Routine preventative health maintenance measures emphasized.  Dr. Adrian BlackwaterStinson consulted prior to obtaining biopsy  Marny LowensteinJulie N Jadalee Westcott, PA-C 08/13/2014 1:09 PM

## 2014-08-13 NOTE — Patient Instructions (Signed)

## 2014-08-18 ENCOUNTER — Telehealth: Payer: Self-pay

## 2014-08-18 NOTE — Telephone Encounter (Signed)
Attempted to contact patient to inform her of colpo results and the need to repeat colpo and pap in 6 months. Someone picked up phone but would not answer. Unable to leave message. Will attempt at later time.

## 2014-08-18 NOTE — Telephone Encounter (Signed)
Called patient and informed her of results and recommendations. Advised she call in September to schedule appointment for colpo and pap in November. Patient verbalized understanding and gratitude. No questions or concerns.

## 2014-08-18 NOTE — Telephone Encounter (Signed)
-----   Message from Marny LowensteinJulie N Wenzel, PA-C sent at 08/15/2014  5:11 PM EDT ----- Please call patient and inform her of colpo results showing CIN-1 and CIN-2. Due to patient's age, we will repeat colposcopy and pap in 6 months. Very important that patient follows-up as indicated.   Thanks!  Raynelle FanningJulie

## 2014-09-01 ENCOUNTER — Encounter: Payer: Self-pay | Admitting: General Practice

## 2014-10-12 IMAGING — US US OB COMP +14 WK
2 series · 12 of 28 positions shown · non-contrast
Comparison: none

[Series 1: us ob comp +14 wk · 10 of 109 slices shown (1 of 2)]
[im 5/109]
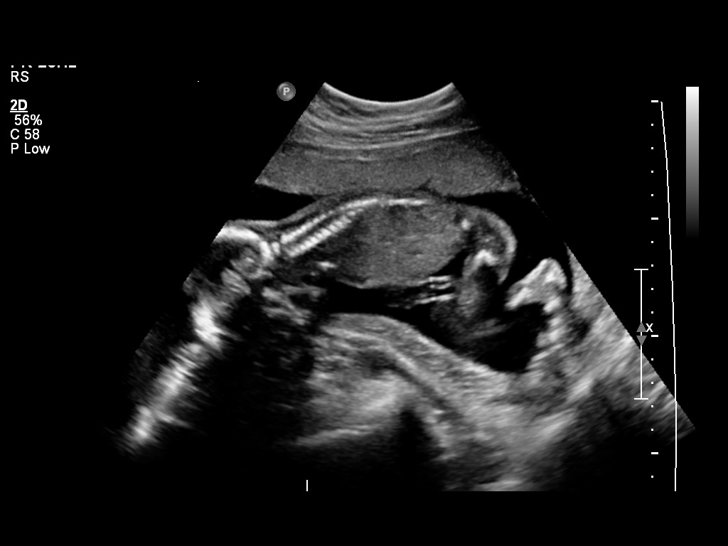
[im 15/109]
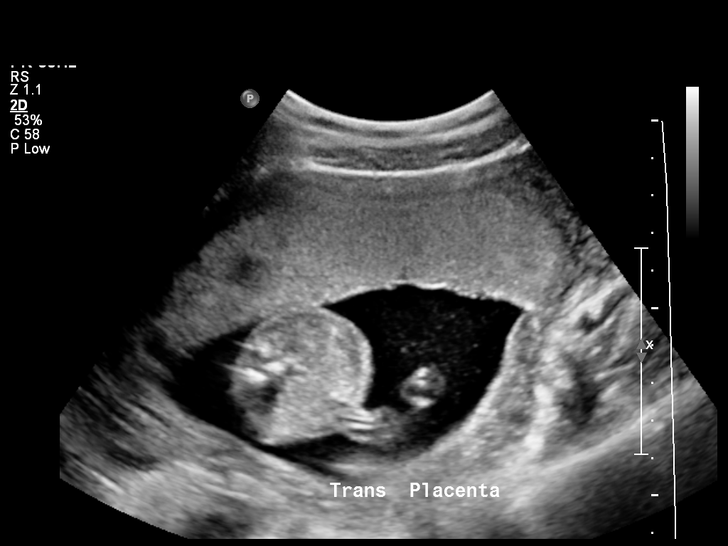
[im 24/109]
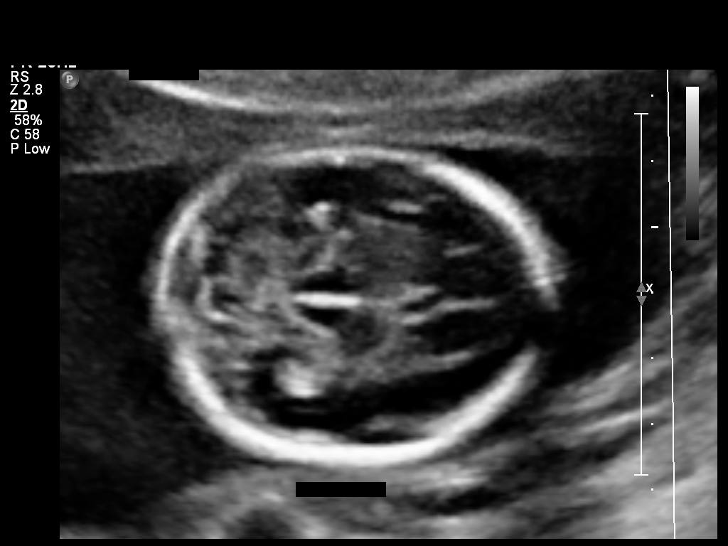
[im 38/109]
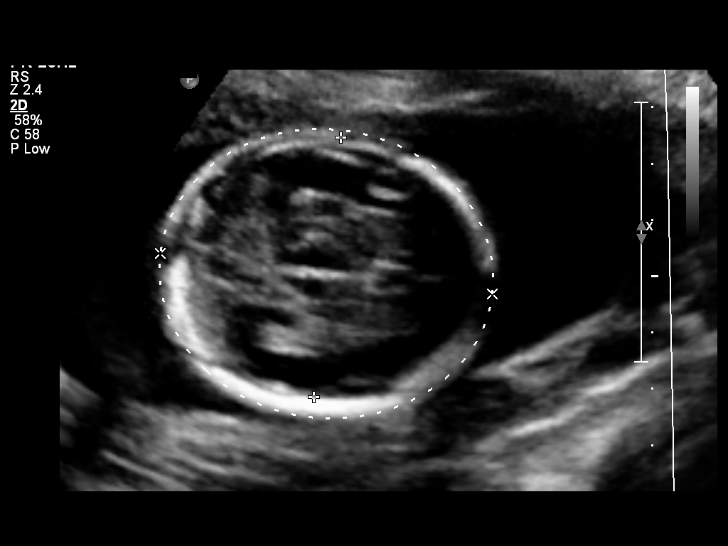
[im 47/109]
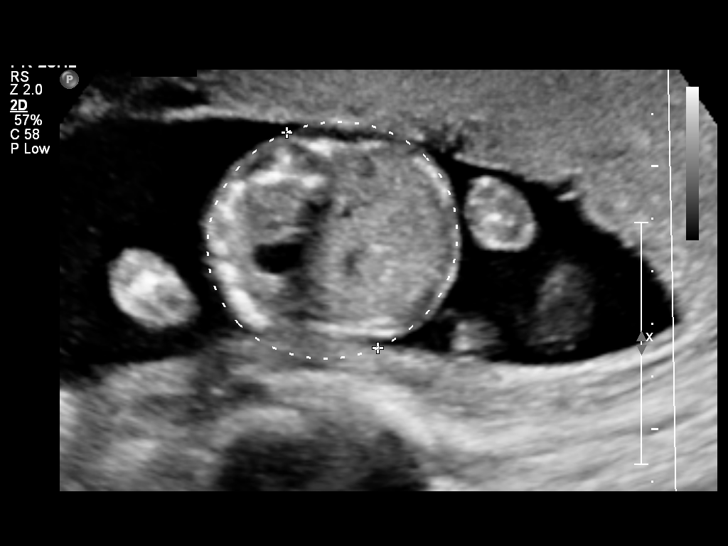
[im 57/109]
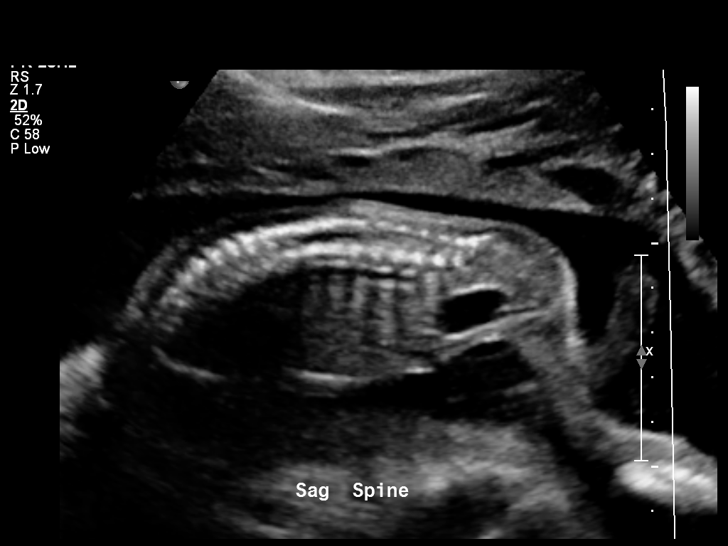
[im 71/109]
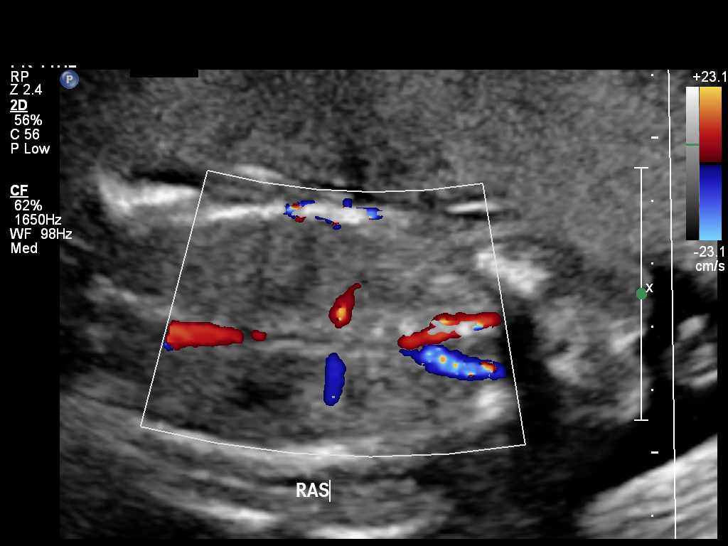
[im 80/109]
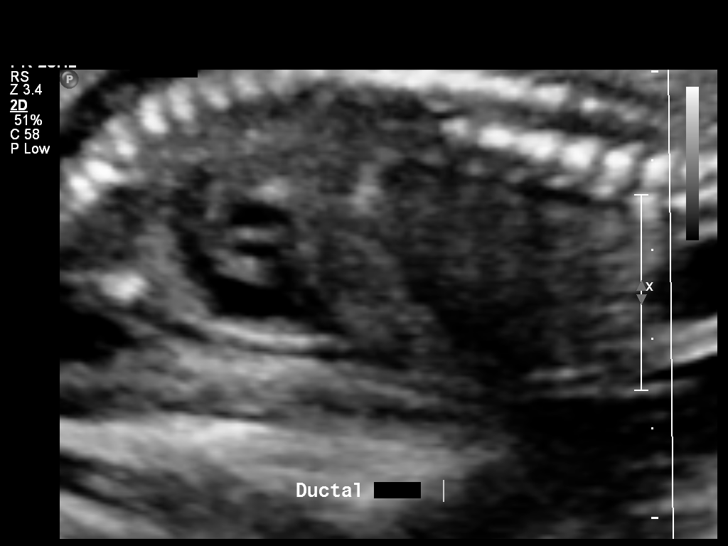
[im 90/109]
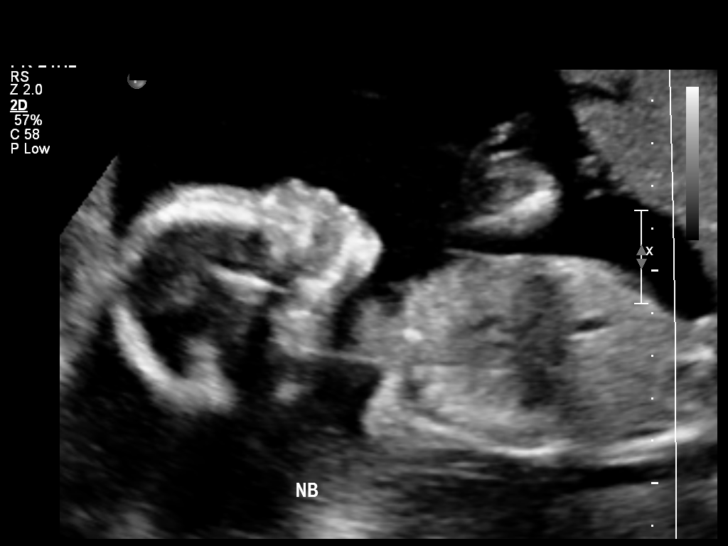
[im 104/109]
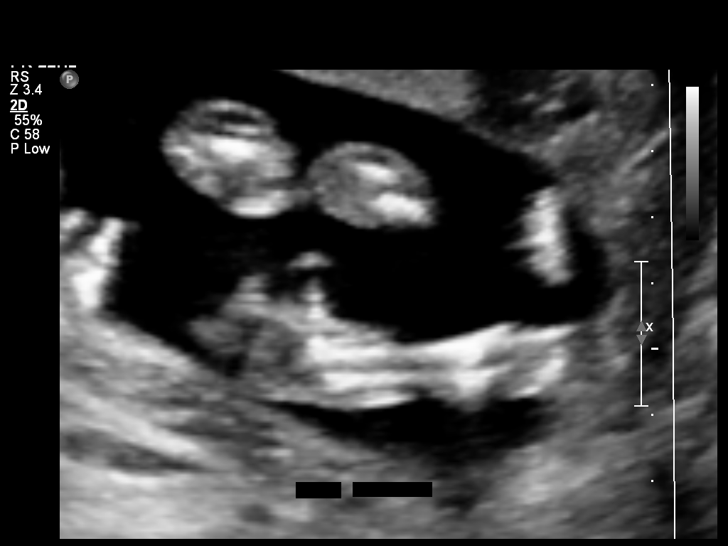

[Series 1: us ob comp +14 wk · 16 acquisitions, 2 frames shown (2 of 2)]
[im 1/16]
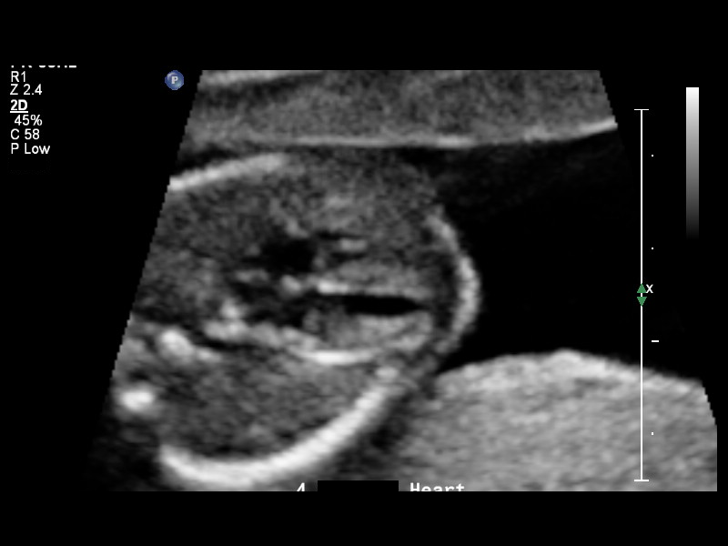
[im 11/16]
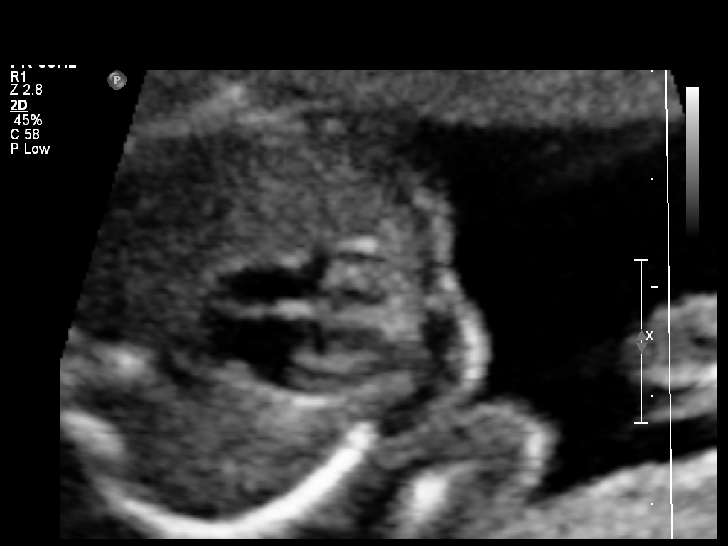

[12 of 28 positions shown; findings below may reference images not displayed]

OBSTETRICS REPORT
                      (Signed Final 08/04/2013 [DATE])

Service(s) Provided

 US OB COMP + 14 WK                                    76805.1
Indications

 Basic anatomic survey
 No or Little Prenatal Care
Fetal Evaluation

 Num Of Fetuses:    1
 Fetal Heart Rate:  160                          bpm
 Cardiac Activity:  Observed
 Presentation:      Breech
 Placenta:          Anterior, above cervical os
 P. Cord            Visualized, central
 Insertion:

 Amniotic Fluid
 AFI FV:      Subjectively within normal limits
 AFI Sum:     4.9     cm      < 3  %Tile
Biometry

 BPD:       46  mm     G. Age:  19w 6d                CI:        72.52   70 - 86
                                                      FL/HC:      18.6   16.8 -

 HC:     171.8  mm     G. Age:  19w 5d       31  %    HC/AC:      1.18   1.09 -

 AC:     145.1  mm     G. Age:  19w 6d       39  %    FL/BPD:
 FL:      31.9  mm     G. Age:  20w 0d       40  %    FL/AC:      22.0   20 - 24
 HUM:     30.5  mm     G. Age:  20w 1d       56  %
 CER:     18.9  mm     G. Age:  18w 3d      < 5  %
 NFT:     5.19  mm

 Est. FW:     318  gm    0 lb 11 oz      48  %
Gestational Age

 LMP:           20w 0d        Date:  03/17/13                 EDD:   12/22/13
 U/S Today:     19w 6d                                        EDD:   12/23/13
 Best:          20w 0d     Det. By:  LMP  (03/17/13)          EDD:   12/22/13
Anatomy
 Cranium:          Appears normal         Aortic Arch:      Appears normal
 Fetal Cavum:      Appears normal         Ductal Arch:      Appears normal
 Ventricles:       Appears normal         Diaphragm:        Appears normal
 Choroid Plexus:   Appears normal         Stomach:          Appears normal, left
                                                            sided
 Cerebellum:       Appears normal         Abdomen:          Appears normal
 Posterior Fossa:  Appears normal         Abdominal Wall:   Appears nml (cord
                                                            insert, abd wall)
 Nuchal Fold:      Appears normal         Cord Vessels:     Appears normal (3
                                                            vessel cord)
 Face:             Appears normal         Kidneys:          Appear normal
                   (orbits and profile)
 Lips:             Appears normal         Bladder:          Appears normal
 Heart:            Appears normal         Spine:            Appears normal
                   (4CH, axis, and
                   situs)
 RVOT:             Appears normal         Lower             Appears normal
                                          Extremities:
 LVOT:             Appears normal         Upper             Appears normal
                                          Extremities:

 Other:  Fetus appears to be a female. Nasal bone visualized. Heels and 5th
         digit visualized.
Targeted Anatomy

 Fetal Central Nervous System
 Lat. Ventricles:  4.3                    Cisterna Magna:
Cervix Uterus Adnexa

 Cervical Length:    3.06     cm

 Cervix:       Normal appearance by transabdominal scan.
 Uterus:       No abnormality visualized.

 Left Ovary:    Not visualized.
 Right Ovary:   Within normal limits.
 Adnexa:     No adnexal mass visualized.
Impression

 SIUP at 20+0 weeks
 Normal detailed fetal anatomy
 Markers of aneuploidy: none
 Normal amniotic fluid volume
 Measurements consistent with LMP dating
Recommendations

 Follow-up as clinically indicated

## 2018-10-05 ENCOUNTER — Other Ambulatory Visit: Payer: Self-pay

## 2018-10-05 ENCOUNTER — Ambulatory Visit (HOSPITAL_COMMUNITY)
Admission: EM | Admit: 2018-10-05 | Discharge: 2018-10-05 | Disposition: A | Discharge status: Home / Self Care | Payer: 59 | Attending: Family Medicine | Admitting: Family Medicine

## 2018-10-05 DIAGNOSIS — M545 Low back pain, unspecified: Secondary | ICD-10-CM

## 2018-10-05 DIAGNOSIS — M62830 Muscle spasm of back: Secondary | ICD-10-CM

## 2018-10-05 MED ORDER — CYCLOBENZAPRINE HCL 10 MG PO TABS: ORAL_TABLET | ORAL | 0 refills | Status: AC

## 2018-10-05 MED ORDER — DICLOFENAC SODIUM 75 MG PO TBEC: 75.0000 mg | DELAYED_RELEASE_TABLET | Freq: Two times a day (BID) | ORAL | 0 refills | Status: AC

## 2018-10-05 NOTE — ED Triage Notes (Signed)
Per pt she has ben having lower back pain for about 1 week. Says it has gotten worse the last few days, Pt has hx of spasms in her back. Pt does lift and stoop at work a lot.

## 2018-10-05 NOTE — ED Provider Notes (Signed)
[place]   [id number] 10/05/18 Arrival Time: 4098  ASSESSMENT & PLAN:  1. Acute bilateral low back pain without sciatica   2. Muscle spasm of back     Able to ambulate here and hemodynamically stable. No indication for imaging of back at this time given no trauma and normal neurological exam. Discussed.  Meds ordered this encounter  Medications  . diclofenac (VOLTAREN) 75 MG EC tablet    Sig: Take 1 tablet (75 mg total) by mouth 2 (two) times daily.    Dispense:  14 tablet    Refill:  0  . cyclobenzaprine (FLEXERIL) 10 MG tablet    Sig: Take 1 tablet by mouth 3 times daily as needed for muscle spasm. Warning: May cause drowsiness.    Dispense:  21 tablet    Refill:  0   Work note provided. Medication sedation precautions given. Encourage ROM/movement as tolerated.  Follow-up Information    [facility].   Why: If you are not improving over the next week. Contact information: [address] [place] [phone]          Reviewed expectations re: course of current medical issues. Questions answered. Outlined signs and symptoms indicating need for more acute intervention. Patient verbalized understanding. After Visit Summary given.   SUBJECTIVE: History from: patient.  Judith Little is a 23 y.o. female who presents with complaint of intermittent bilateral lower back discomfort. Onset gradual, over the past week. Injury/trama: no, but questions feeling pain after lifting items at work. History of back problems: occasional; lower soreness that improves with ibuprofen. Discomfort described as aching; occasional transient sharp pain; without radiation. Pain is worse with prolonged standing/walking and is better with rest. Progressive LE weakness or saddle anesthesia: none. Extremity sensation changes or weakness: none. Ambulatory without difficulty. Normal bowel/bladder habits: yes; without  urinary retention. No associated abdominal pain/n/v. Self treatment: has tried ibuprofen prn without much help; sporadic use.  Reports no chronic steroid use, fevers, IV drug use, or recent back surgeries or procedures.  ROS: As per HPI. All other systems negative.   OBJECTIVE:  Vitals:   10/05/18 1438  BP: 118/77  Pulse: 90  Resp: 16  Temp: 98.8 F (37.1 C)  TempSrc: Oral  SpO2: 100%    General appearance: alert; no distress Neck: supple with FROM; without midline tenderness CV: RRR Lungs: unlabored respirations; symmetrical air entry Abdomen: soft, non-tender; non-distended Back: mild to moderate bilateral tenderness of her lower paraspinal musculature; FROM at waist; bruising: none; without midline tenderness Extremities: no edema; symmetrical with no gross deformities; normal ROM of bilateral lower extremities Skin: warm and dry Neurologic: normal gait; normal reflexes of RLE and LLE; normal sensation of RLE and LLE; normal strength of RLE and LLE Psychological: alert and cooperative; normal mood and affect  No Known Allergies  Past Medical History:  Diagnosis Date  . Abnormal Pap smear of cervix 06/26/14   LSIL (come cells may be higher grade lesions) Colpo 08/13/14  . Asthma   . Bronchitis    Social History   Socioeconomic History  . Marital status: Single    Spouse name: Not on file  . Number of children: Not on file  . Years of education: Not on file  . Highest education level: Not on file  Occupational History  . Not on file  Social Needs  . Financial resource strain: Not on file  . Food insecurity    Worry:  Not on file    Inability: Not on file  . Transportation needs    Medical: Not on file    Non-medical: Not on file  Tobacco Use  . Smoking status: Never Smoker  . Smokeless tobacco: Never Used  Substance and Sexual Activity  . Alcohol use: No  . Drug use: No  . Sexual activity: Yes    Birth control/protection: Implant  Lifestyle  . Physical  activity    Days per week: Not on file    Minutes per session: Not on file  . Stress: Not on file  Relationships  . Social Musicianconnections    Talks on phone: Not on file    Gets together: Not on file    Attends religious service: Not on file    Active member of club or organization: Not on file    Attends meetings of clubs or organizations: Not on file    Relationship status: Not on file  . Intimate partner violence    Fear of current or ex partner: Not on file    Emotionally abused: Not on file    Physically abused: Not on file    Forced sexual activity: Not on file  Other Topics Concern  . Not on file  Social History Narrative  . Not on file   Family History  Problem Relation Age of Onset  . Diabetes Maternal Grandmother   . Diabetes Paternal Grandmother    Past Surgical History:  Procedure Laterality Date  . WISDOM TOOTH EXTRACTION       Mardella LaymanHagler, Larose Batres, MD 10/05/18 [phone]

## 2018-10-05 NOTE — Discharge Instructions (Addendum)

## 2019-02-15 ENCOUNTER — Encounter (HOSPITAL_COMMUNITY): Payer: Self-pay

## 2019-02-15 ENCOUNTER — Other Ambulatory Visit: Payer: Self-pay

## 2019-02-15 ENCOUNTER — Ambulatory Visit (HOSPITAL_COMMUNITY)
Admission: EM | Admit: 2019-02-15 | Discharge: 2019-02-15 | Disposition: A | Discharge status: Home / Self Care | Payer: 59 | Attending: Emergency Medicine | Admitting: Emergency Medicine

## 2019-02-15 DIAGNOSIS — J039 Acute tonsillitis, unspecified: Secondary | ICD-10-CM

## 2019-02-15 LAB — POCT RAPID STREP A: Streptococcus, Group A Screen (Direct): NEGATIVE

## 2019-02-15 MED ORDER — AMOXICILLIN 500 MG PO CAPS: 500.0000 mg | ORAL_CAPSULE | Freq: Three times a day (TID) | ORAL | 0 refills | Status: AC

## 2019-02-15 NOTE — ED Triage Notes (Addendum)
Pt reports she is "having tonsillitis, it happens every year around this time" x 2 days. Pt states she had sore throat 2 days ago and she has white patches in her throat.

## 2019-02-15 NOTE — ED Provider Notes (Signed)
[facility]    CSN: [id number] Arrival date & time: 02/15/19  1054      History   Chief Complaint Chief Complaint  Patient presents with  . tonsil problem    HPI Judith Little is a 23 y.o. female.   Patient here concerned with "tonsilitis" x 2 days.  Admits sore throat, enlarged tonsils, denies fever, chills, URI sx, cough, wheezing, SOB.  No advil or tylenol today, she has been gargling warm salt water which is helping.  No sick contacts, no known exposures to COVID19.     Past Medical History:  Diagnosis Date  . Abnormal Pap smear of cervix 06/26/14   LSIL (come cells may be higher grade lesions) Colpo 08/13/14  . Asthma   . Bronchitis     Patient Active Problem List   Diagnosis Date Noted  . Abnormal Pap smear of cervix 08/13/2014    Past Surgical History:  Procedure Laterality Date  . WISDOM TOOTH EXTRACTION      OB History    Gravida  1   Para  1   Term  1   Preterm      AB      Living  1     SAB      TAB      Ectopic      Multiple      Live Births  1            Home Medications    Prior to Admission medications   Medication Sig Start Date End Date Taking? Authorizing Provider  amoxicillin (AMOXIL) 500 MG capsule Take 1 capsule (500 mg total) by mouth 3 (three) times daily. 02/15/19   Evern Core, PA-C  cyclobenzaprine (FLEXERIL) 10 MG tablet Take 1 tablet by mouth 3 times daily as needed for muscle spasm. Warning: May cause drowsiness. 10/05/18   Mardella Layman, MD  diclofenac (VOLTAREN) 75 MG EC tablet Take 1 tablet (75 mg total) by mouth 2 (two) times daily. 10/05/18   Mardella Layman, MD  Prenatal Vit-Fe Fumarate-FA (PRENATAL MULTIVITAMIN) TABS tablet Take 1 tablet by mouth daily at 12 noon.    [provider]    Family History Family History  Problem Relation Age of Onset  . Diabetes Maternal Grandmother   . Diabetes Paternal Grandmother     Social History Social History   Tobacco Use  .  Smoking status: Never Smoker  . Smokeless tobacco: Never Used  Substance Use Topics  . Alcohol use: No  . Drug use: No     Allergies   Patient has no known allergies.   Review of Systems Review of Systems  Constitutional: Negative for chills and fatigue.  HENT: Positive for sore throat. Negative for congestion, ear discharge, ear pain, facial swelling, rhinorrhea and sneezing.   Eyes: Negative for pain and redness.  Respiratory: Negative for cough, shortness of breath and wheezing.   Gastrointestinal: Negative for abdominal pain, diarrhea, nausea and vomiting.  Musculoskeletal: Negative for arthralgias and neck pain.  Skin: Negative for color change and rash.  Neurological: Negative for headaches.  Hematological: Negative for adenopathy. Does not bruise/bleed easily.  Psychiatric/Behavioral: Negative for sleep disturbance.  All other systems reviewed and are negative.    Physical Exam Triage Vital Signs ED Triage Vitals  Enc Vitals Group     BP 02/15/19 1220 133/79     Pulse Rate 02/15/19 1220 87     Resp 02/15/19 1220 15     Temp 02/15/19  1220 98.3 F (36.8 C)     Temp Source 02/15/19 1220 Oral     SpO2 02/15/19 1220 100 %     Weight --      Height --      Head Circumference --      Peak Flow --      Pain Score 02/15/19 1217 0     Pain Loc --      Pain Edu? --      Excl. in [place]? --    No data found.  Updated Vital Signs BP 133/79 (BP Location: Left Arm)   Pulse 87   Temp 98.3 F (36.8 C) (Oral)   Resp 15   LMP 02/13/2019 (Exact Date)   SpO2 100%   Visual Acuity Right Eye Distance:   Left Eye Distance:   Bilateral Distance:    Right Eye Near:   Left Eye Near:    Bilateral Near:     Physical Exam Vitals signs and nursing note reviewed.  Constitutional:      General: She is not in acute distress.    Appearance: Normal appearance. She is well-developed. She is obese. She is not ill-appearing or toxic-appearing.  HENT:     Head: Normocephalic and  atraumatic.     Right Ear: Tympanic membrane and ear canal normal. No swelling or tenderness. No middle ear effusion. Tympanic membrane is not injected, scarred, perforated, retracted or bulging.     Left Ear: Tympanic membrane and ear canal normal. No swelling or tenderness.  No middle ear effusion. Tympanic membrane is not injected, scarred, perforated, retracted or bulging.     Nose: No mucosal edema, congestion or rhinorrhea.     Right Turbinates: Not enlarged or swollen.     Left Turbinates: Not enlarged or swollen.     Right Sinus: No frontal sinus tenderness.     Left Sinus: No maxillary sinus tenderness or frontal sinus tenderness.     Mouth/Throat:     Mouth: Mucous membranes are moist.     Pharynx: Oropharynx is clear. No pharyngeal swelling, oropharyngeal exudate, posterior oropharyngeal erythema or uvula swelling.     Tonsils: Tonsillar exudate present. No tonsillar abscesses. 1+ on the right. 3+ on the left.  Eyes:     General: No scleral icterus.    Conjunctiva/sclera: Conjunctivae normal.  Neck:     Musculoskeletal: Neck supple.  Cardiovascular:     Rate and Rhythm: Normal rate and regular rhythm.     Heart sounds: No murmur.  Pulmonary:     Effort: Pulmonary effort is normal. No respiratory distress.     Breath sounds: Normal breath sounds. No wheezing, rhonchi or rales.  Musculoskeletal: Normal range of motion.  Lymphadenopathy:     Cervical: No cervical adenopathy.  Skin:    General: Skin is warm and dry.     Capillary Refill: Capillary refill takes less than 2 seconds.  Neurological:     General: No focal deficit present.     Mental Status: She is alert and oriented to person, place, and time.  Psychiatric:        Mood and Affect: Mood normal.        Behavior: Behavior normal.      UC Treatments / Results  Labs (all labs ordered are listed, but only abnormal results are displayed) Labs Reviewed  CULTURE, GROUP A STREP [facility])  POCT RAPID STREP A    EKG    Radiology No results found.  Procedures Procedures (including critical  care time)  Medications Ordered in UC Medications - No data to display  Initial Impression / Assessment and Plan / UC Course  I have reviewed the triage vital signs and the nursing notes.  Pertinent labs & imaging results that were available during my care of the patient were reviewed by me and considered in my medical decision making (see chart for details).      Final Clinical Impressions(s) / UC Diagnoses   Final diagnoses:  Acute tonsillitis, unspecified etiology   Discharge Instructions   None    ED Prescriptions    Medication Sig Dispense Auth. Provider   amoxicillin (AMOXIL) 500 MG capsule Take 1 capsule (500 mg total) by mouth 3 (three) times daily. 21 capsule Evern CoreLindquist, Calan Doren, PA-C     PDMP not reviewed this encounter.   Evern CoreLindquist, Anaia Frith, PA-C 02/15/19 1311

## 2019-02-15 NOTE — Discharge Instructions (Signed)
Take medication as prescribed. Follow up with PCP if new or worsening symptoms, or if no improvement after 3 days. Take ibuprofen as needed for pain.

## 2019-02-17 LAB — CULTURE, GROUP A STREP (THRC)

## 2019-10-27 ENCOUNTER — Other Ambulatory Visit: Payer: Self-pay

## 2019-10-27 ENCOUNTER — Ambulatory Visit (INDEPENDENT_AMBULATORY_CARE_PROVIDER_SITE_OTHER): Payer: Self-pay

## 2019-10-27 DIAGNOSIS — Z23 Encounter for immunization: Secondary | ICD-10-CM

## 2019-11-18 ENCOUNTER — Ambulatory Visit: Payer: Self-pay

## 2019-11-21 ENCOUNTER — Other Ambulatory Visit: Payer: Self-pay

## 2019-11-21 ENCOUNTER — Ambulatory Visit (INDEPENDENT_AMBULATORY_CARE_PROVIDER_SITE_OTHER): Payer: Self-pay

## 2019-11-21 DIAGNOSIS — Z23 Encounter for immunization: Secondary | ICD-10-CM
